# Patient Record
Sex: Female | Born: 1980 | Race: White | Hispanic: No | Marital: Married | Smoking: Current every day smoker
Health system: Southern US, Community
[De-identification: ages and names within clinical notes are randomized; demographics above are authoritative.]

## PROBLEM LIST (undated history)

## (undated) DIAGNOSIS — J45909 Unspecified asthma, uncomplicated: Secondary | ICD-10-CM

## (undated) DIAGNOSIS — M549 Dorsalgia, unspecified: Secondary | ICD-10-CM

## (undated) DIAGNOSIS — M1990 Unspecified osteoarthritis, unspecified site: Secondary | ICD-10-CM

## (undated) DIAGNOSIS — F329 Major depressive disorder, single episode, unspecified: Secondary | ICD-10-CM

## (undated) DIAGNOSIS — E119 Type 2 diabetes mellitus without complications: Secondary | ICD-10-CM

## (undated) DIAGNOSIS — F32A Depression, unspecified: Secondary | ICD-10-CM

## (undated) DIAGNOSIS — K219 Gastro-esophageal reflux disease without esophagitis: Secondary | ICD-10-CM

## (undated) HISTORY — PX: WISDOM TOOTH EXTRACTION: SHX21

## (undated) HISTORY — PX: APPENDECTOMY: SHX54

## (undated) HISTORY — DX: Type 2 diabetes mellitus without complications: E11.9

## (undated) HISTORY — DX: Dorsalgia, unspecified: M54.9

---
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
## 2003-12-26 ENCOUNTER — Emergency Department: Payer: Self-pay | Admitting: Emergency Medicine

## 2004-02-01 ENCOUNTER — Emergency Department: Payer: Self-pay | Admitting: Unknown Physician Specialty

## 2004-02-24 ENCOUNTER — Emergency Department: Payer: Self-pay | Admitting: Emergency Medicine

## 2004-03-22 ENCOUNTER — Emergency Department: Payer: Self-pay | Admitting: Emergency Medicine

## 2004-05-03 ENCOUNTER — Emergency Department: Payer: Self-pay | Admitting: Emergency Medicine

## 2004-11-23 ENCOUNTER — Emergency Department: Payer: Self-pay | Admitting: Emergency Medicine

## 2004-12-29 ENCOUNTER — Emergency Department: Payer: Self-pay | Admitting: Emergency Medicine

## 2005-01-25 ENCOUNTER — Emergency Department: Payer: Self-pay | Admitting: Emergency Medicine

## 2005-04-02 ENCOUNTER — Emergency Department: Payer: Self-pay | Admitting: Emergency Medicine

## 2005-05-10 ENCOUNTER — Emergency Department: Payer: Self-pay | Admitting: Emergency Medicine

## 2005-05-13 ENCOUNTER — Emergency Department: Payer: Self-pay | Admitting: Unknown Physician Specialty

## 2005-05-25 ENCOUNTER — Emergency Department: Payer: Self-pay | Admitting: General Practice

## 2005-06-10 ENCOUNTER — Emergency Department: Payer: Self-pay | Admitting: Emergency Medicine

## 2005-06-11 ENCOUNTER — Emergency Department: Payer: Self-pay | Admitting: Emergency Medicine

## 2005-08-01 ENCOUNTER — Emergency Department: Payer: Self-pay | Admitting: Emergency Medicine

## 2005-12-06 ENCOUNTER — Emergency Department: Payer: Self-pay | Admitting: Emergency Medicine

## 2006-01-10 ENCOUNTER — Emergency Department: Payer: Self-pay | Admitting: Emergency Medicine

## 2006-02-12 ENCOUNTER — Ambulatory Visit: Payer: Self-pay | Admitting: Obstetrics and Gynecology

## 2006-02-13 ENCOUNTER — Ambulatory Visit: Payer: Self-pay | Admitting: Obstetrics and Gynecology

## 2006-04-13 ENCOUNTER — Emergency Department: Payer: Self-pay

## 2006-04-24 ENCOUNTER — Emergency Department: Payer: Self-pay | Admitting: General Practice

## 2006-05-29 ENCOUNTER — Emergency Department: Payer: Self-pay | Admitting: Emergency Medicine

## 2006-10-01 ENCOUNTER — Emergency Department: Payer: Self-pay | Admitting: Emergency Medicine

## 2006-10-03 ENCOUNTER — Emergency Department: Payer: Self-pay | Admitting: Emergency Medicine

## 2006-12-15 ENCOUNTER — Emergency Department: Payer: Self-pay | Admitting: Emergency Medicine

## 2006-12-17 ENCOUNTER — Encounter: Payer: Self-pay | Admitting: *Deleted

## 2006-12-17 ENCOUNTER — Ambulatory Visit: Payer: Self-pay | Admitting: *Deleted

## 2007-01-15 ENCOUNTER — Encounter: Payer: Self-pay | Admitting: *Deleted

## 2007-01-29 ENCOUNTER — Ambulatory Visit: Payer: Self-pay | Admitting: Obstetrics and Gynecology

## 2007-02-20 ENCOUNTER — Inpatient Hospital Stay: Payer: Self-pay | Admitting: Obstetrics and Gynecology

## 2007-02-27 ENCOUNTER — Inpatient Hospital Stay: Payer: Self-pay | Admitting: Internal Medicine

## 2007-03-31 ENCOUNTER — Ambulatory Visit: Payer: Self-pay | Admitting: Family Medicine

## 2007-12-08 ENCOUNTER — Emergency Department: Payer: Self-pay | Admitting: Internal Medicine

## 2008-02-06 ENCOUNTER — Emergency Department: Payer: Self-pay | Admitting: Emergency Medicine

## 2008-04-01 ENCOUNTER — Emergency Department: Payer: Self-pay

## 2008-05-18 ENCOUNTER — Ambulatory Visit: Payer: Self-pay | Admitting: Family Medicine

## 2008-10-13 ENCOUNTER — Emergency Department: Payer: Self-pay | Admitting: Emergency Medicine

## 2008-11-19 ENCOUNTER — Emergency Department: Payer: Self-pay | Admitting: Unknown Physician Specialty

## 2009-10-19 ENCOUNTER — Emergency Department: Payer: Self-pay | Admitting: Emergency Medicine

## 2009-11-01 ENCOUNTER — Ambulatory Visit: Payer: Self-pay

## 2009-12-09 ENCOUNTER — Emergency Department: Payer: Self-pay | Admitting: Emergency Medicine

## 2010-04-05 ENCOUNTER — Encounter: Payer: Self-pay | Admitting: Family Medicine

## 2010-04-16 ENCOUNTER — Emergency Department: Payer: Self-pay | Admitting: Emergency Medicine

## 2010-10-18 ENCOUNTER — Ambulatory Visit: Payer: Self-pay

## 2011-12-09 ENCOUNTER — Emergency Department: Payer: Self-pay | Admitting: Emergency Medicine

## 2011-12-12 ENCOUNTER — Emergency Department: Payer: Self-pay | Admitting: Emergency Medicine

## 2011-12-12 LAB — CBC WITH DIFFERENTIAL/PLATELET
Basophil %: 0.2 %
Eosinophil #: 0.4 10*3/uL (ref 0.0–0.7)
HCT: 39.3 % (ref 35.0–47.0)
HGB: 13.4 g/dL (ref 12.0–16.0)
Lymphocyte %: 32.1 %
MCH: 27.2 pg (ref 26.0–34.0)
MCHC: 34.2 g/dL (ref 32.0–36.0)
MCV: 80 fL (ref 80–100)
Monocyte %: 4.1 %
Neutrophil #: 11.6 10*3/uL — ABNORMAL HIGH (ref 1.4–6.5)
RBC: 4.94 10*6/uL (ref 3.80–5.20)
RDW: 14.2 % (ref 11.5–14.5)
WBC: 18.7 10*3/uL — ABNORMAL HIGH (ref 3.6–11.0)

## 2011-12-12 LAB — BASIC METABOLIC PANEL
Anion Gap: 8 (ref 7–16)
BUN: 14 mg/dL (ref 7–18)
Calcium, Total: 9 mg/dL (ref 8.5–10.1)
Chloride: 105 mmol/L (ref 98–107)
Co2: 25 mmol/L (ref 21–32)
Creatinine: 0.81 mg/dL (ref 0.60–1.30)
EGFR (African American): >60
EGFR (Non-African Amer.): >60
Osmolality: 283 (ref 275–301)
Potassium: 4 mmol/L (ref 3.5–5.1)

## 2011-12-12 LAB — URINALYSIS, COMPLETE
Glucose,UR: 50 mg/dL (ref 0–75)
Nitrite: POSITIVE
Protein: 100
Specific Gravity: 1.031 (ref 1.003–1.030)
Squamous Epithelial: 1

## 2012-08-23 ENCOUNTER — Emergency Department: Payer: Self-pay | Admitting: Emergency Medicine

## 2012-09-12 ENCOUNTER — Emergency Department: Payer: Self-pay | Admitting: Emergency Medicine

## 2013-05-08 ENCOUNTER — Emergency Department: Payer: Self-pay | Admitting: Emergency Medicine

## 2013-07-24 ENCOUNTER — Emergency Department: Payer: Self-pay | Admitting: Internal Medicine

## 2013-07-28 ENCOUNTER — Emergency Department: Payer: Self-pay | Admitting: Internal Medicine

## 2014-06-29 ENCOUNTER — Ambulatory Visit: Payer: Self-pay

## 2015-02-28 ENCOUNTER — Emergency Department: Payer: Medicaid Other

## 2015-02-28 ENCOUNTER — Emergency Department
Admission: EM | Admit: 2015-02-28 | Discharge: 2015-02-28 | Disposition: A | Discharge status: Home / Self Care | Payer: Medicaid Other | Attending: Emergency Medicine | Admitting: Emergency Medicine

## 2015-02-28 ENCOUNTER — Encounter: Payer: Self-pay | Admitting: Medical Oncology

## 2015-02-28 DIAGNOSIS — F17200 Nicotine dependence, unspecified, uncomplicated: Secondary | ICD-10-CM | POA: Diagnosis not present

## 2015-02-28 DIAGNOSIS — N939 Abnormal uterine and vaginal bleeding, unspecified: Secondary | ICD-10-CM | POA: Diagnosis not present

## 2015-02-28 DIAGNOSIS — R102 Pelvic and perineal pain: Secondary | ICD-10-CM

## 2015-02-28 DIAGNOSIS — R109 Unspecified abdominal pain: Secondary | ICD-10-CM | POA: Diagnosis present

## 2015-02-28 HISTORY — DX: Unspecified asthma, uncomplicated: J45.909

## 2015-02-28 LAB — COMPREHENSIVE METABOLIC PANEL
ALK PHOS: 92 U/L (ref 38–126)
ALT: 21 U/L (ref 14–54)
AST: 16 U/L (ref 15–41)
Albumin: 3.3 g/dL — ABNORMAL LOW (ref 3.5–5.0)
Anion gap: 5 (ref 5–15)
BILIRUBIN TOTAL: 0.5 mg/dL (ref 0.3–1.2)
BUN: 10 mg/dL (ref 6–20)
CALCIUM: 8.9 mg/dL (ref 8.9–10.3)
CO2: 29 mmol/L (ref 22–32)
CREATININE: 0.68 mg/dL (ref 0.44–1.00)
Chloride: 103 mmol/L (ref 101–111)
GFR calc Af Amer: >60 mL/min (ref >60)
Glucose, Bld: 114 mg/dL — ABNORMAL HIGH (ref 65–99)
POTASSIUM: 4 mmol/L (ref 3.5–5.1)
Sodium: 137 mmol/L (ref 135–145)
TOTAL PROTEIN: 7.4 g/dL (ref 6.5–8.1)

## 2015-02-28 LAB — CBC
HEMATOCRIT: 41.7 % (ref 35.0–47.0)
Hemoglobin: 14 g/dL (ref 12.0–16.0)
MCH: 27.4 pg (ref 26.0–34.0)
MCHC: 33.7 g/dL (ref 32.0–36.0)
MCV: 81.5 fL (ref 80.0–100.0)
Platelets: 439 10*3/uL (ref 150–440)
RBC: 5.11 MIL/uL (ref 3.80–5.20)
RDW: 14.7 % — AB (ref 11.5–14.5)
WBC: 16.3 10*3/uL — AB (ref 3.6–11.0)

## 2015-02-28 LAB — URINALYSIS COMPLETE WITH MICROSCOPIC (ARMC ONLY)
Bilirubin Urine: NEGATIVE
Glucose, UA: NEGATIVE mg/dL
KETONES UR: NEGATIVE mg/dL
Leukocytes, UA: NEGATIVE
Nitrite: NEGATIVE
PH: 7 (ref 5.0–8.0)
PROTEIN: 30 mg/dL — AB
Specific Gravity, Urine: 1.013 (ref 1.005–1.030)

## 2015-02-28 LAB — HCG, QUANTITATIVE, PREGNANCY: hCG, Beta Chain, Quant, S: 1 m[IU]/mL (ref <5)

## 2015-02-28 LAB — POCT PREGNANCY, URINE: PREG TEST UR: POSITIVE — AB

## 2015-02-28 LAB — LIPASE, BLOOD: Lipase: 18 U/L (ref 11–51)

## 2015-02-28 MED ORDER — NITROFURANTOIN MACROCRYSTAL 100 MG PO CAPS: 100.0000 mg | ORAL_CAPSULE | Freq: Two times a day (BID) | ORAL | Status: DC

## 2015-02-28 NOTE — Discharge Instructions (Signed)
Your urine pregnancy test was positive but the blood test was negative.  Your ultrasound does not show any definitive pregnancy in the uterus or in any other abnormal location.  Please follow up with gynecology in 2 days for a recheck of the blood pregnancy test and further evaluation.    Ultrasound report summary: FINDINGS: Intrauterine gestational sac: There is an intraluminal fluid collection with septation measuring 1.7 x 0.9 x 1.6 cm  Yolk sac: None visualize  Embryo: None visualize  Cardiac Activity: None visualized  Heart Rate: n/a bpm  Subchorionic hemorrhage: None visualized.  Maternal uterus/adnexae: The right ovary exhibits a complex hypoechoic structure with internal echoes measuring 1.7 x 1.3 x 0.8 cm. It does not appear hypervascular. The left ovary could not be demonstrated due to the patient's body habitus and bowel gas.  IMPRESSION: 1. There is intra uterine fluid which does not appear to reflect a true gestational sac but it is indeterminate. No yolk sac or fetal pole or cardiac activity is observed. This could reflect retained products of conception given the vaginal bleeding and the very low beta HCG. 2. Complex appearing right ovarian cystic structure measuring 1.7 cm in maximal dimension. It is not hypervascular and [place] an ectopic pregnancy is felt less likely. Nonvisualization of the left ovary.  Abdominal Pain, Adult Many things can cause abdominal pain. Usually, abdominal pain is not caused by a disease and will improve without treatment. It can often be observed and treated at home. Your health care provider will do a physical exam and possibly order blood tests and X-rays to help determine the seriousness of your pain. However, in many cases, more time must pass before a clear cause of the pain can be found. Before that point, your health care provider may not know if you need more testing or further treatment. HOME CARE INSTRUCTIONS Monitor  your abdominal pain for any changes. The following actions may help to alleviate any discomfort you are experiencing:  Only take over-the-counter or prescription medicines as directed by your health care provider.  Do not take laxatives unless directed to do so by your health care provider.  Try a clear liquid diet (broth, tea, or water) as directed by your health care provider. Slowly move to a bland diet as tolerated. SEEK MEDICAL CARE IF:  You have unexplained abdominal pain.  You have abdominal pain associated with nausea or diarrhea.  You have pain when you urinate or have a bowel movement.  You experience abdominal pain that wakes you in the night.  You have abdominal pain that is worsened or improved by eating food.  You have abdominal pain that is worsened with eating fatty foods.  You have a fever. SEEK IMMEDIATE MEDICAL CARE IF:  Your pain does not go away within 2 hours.  You keep throwing up (vomiting).  Your pain is felt only in portions of the abdomen, such as the right side or the left lower portion of the abdomen.  You pass bloody or black tarry stools. MAKE SURE YOU:  Understand these instructions.  Will watch your condition.  Will get help right away if you are not doing well or get worse.   This information is not intended to replace advice given to you by your health care provider. Make sure you discuss any questions you have with your health care provider.   Document Released: 10/10/2004 Document Revised: 09/21/2014 Document Reviewed: 09/09/2012 Elsevier Interactive Patient Education 2016 Elsevier Inc.  Abnormal Uterine Bleeding Abnormal uterine  bleeding can affect women at various stages in life, including teenagers, women in their reproductive years, pregnant women, and women who have reached menopause. Several kinds of uterine bleeding are considered abnormal, including:  Bleeding or spotting between periods.   Bleeding after sexual  intercourse.   Bleeding that is heavier or more than normal.   Periods that last longer than usual.  Bleeding after menopause.  Many cases of abnormal uterine bleeding are minor and simple to treat, while others are more serious. Any type of abnormal bleeding should be evaluated by your health care provider. Treatment will depend on the cause of the bleeding. HOME CARE INSTRUCTIONS Monitor your condition for any changes. The following actions may help to alleviate any discomfort you are experiencing:  Avoid the use of tampons and douches as directed by your health care provider.  Change your pads frequently. You should get regular pelvic exams and Pap tests. Keep all follow-up appointments for diagnostic tests as directed by your health care provider.  SEEK MEDICAL CARE IF:   Your bleeding lasts more than 1 week.   You feel dizzy at times.  SEEK IMMEDIATE MEDICAL CARE IF:   You pass out.   You are changing pads every 15 to 30 minutes.   You have abdominal pain.  You have a fever.   You become sweaty or weak.   You are passing large blood clots from the vagina.   You start to feel nauseous and vomit. MAKE SURE YOU:   Understand these instructions.  Will watch your condition.  Will get help right away if you are not doing well or get worse.   This information is not intended to replace advice given to you by your health care provider. Make sure you discuss any questions you have with your health care provider.   Document Released: 12/31/2004 Document Revised: 01/05/2013 Document Reviewed: 07/30/2012 Elsevier Interactive Patient Education [employer]

## 2015-02-28 NOTE — ED Notes (Signed)
Pt reports that she has been having lower abd pain and nausea since yesterday. Has had positive preg test at home over weekend. Reports some vaginal spotting.

## 2015-02-28 NOTE — ED Provider Notes (Addendum)
[facility] Emergency Department Provider Note  ____________________________________________  Time seen: 3:05 PM  I have reviewed the triage vital signs and the nursing notes.   HISTORY  Chief Complaint Nausea and Abdominal Pain    HPI Kendra Perkins is a 35 y.o. female who complains of sharp pelvic pain for the past 24 hours and vaginal spotting for the past 24 hours as well. She took several pregnancy tests at home and at the health Department which had positive and negative results. Her last menstrual. Was 12/28/2014. She has been sexually active, unprotected, with her husband. Denies any vaginal discharge. No specific abdominal pain nausea vomiting diarrhea or fevers.     Past Medical History  Diagnosis Date  . Asthma      There are no active problems to display for this patient.    Past Surgical History  Procedure Laterality Date  . Cesarean section       No current outpatient prescriptions on file.   Allergies Review of patient's allergies indicates no known allergies.   No family history on file.  Social History Social History  Substance Use Topics  . Smoking status: Current Every Day Smoker  . Smokeless tobacco: None  . Alcohol Use: No    Review of Systems  Constitutional:   No fever or chills. No weight changes Eyes:   No blurry vision or double vision.  ENT:   No sore throat. Cardiovascular:   No chest pain. Respiratory:   No dyspnea or cough. Gastrointestinal:   Positive low abdominal pain without vomiting and diarrhea.  No BRBPR or melena. Genitourinary:   Negative for dysuria, urinary retention, bloody urine, or difficulty urinating. Musculoskeletal:   Negative for back pain. No joint swelling or pain. Skin:   Negative for rash. Neurological:   Negative for headaches, focal weakness or numbness. Psychiatric:  No anxiety or depression.   Endocrine:  No hot/cold intolerance, changes in energy, or sleep  difficulty.  10-point ROS otherwise negative.  ____________________________________________   PHYSICAL EXAM:  VITAL SIGNS: ED Triage Vitals  Enc Vitals Group     BP 02/28/15 1211 119/77 mmHg     Pulse Rate 02/28/15 1211 104     Resp 02/28/15 1211 19     Temp 02/28/15 1211 97.8 F (36.6 C)     Temp Source 02/28/15 1211 Oral     SpO2 02/28/15 1211 99 %     Weight 02/28/15 1211 230 lb (104.327 kg)     Height 02/28/15 1211  (1.676 m)     Head Cir --      Peak Flow --      Pain Score 02/28/15 1212 8     Pain Loc --      Pain Edu? --      Excl. in GC? --     Vital signs reviewed, nursing assessments reviewed.   Constitutional:   Alert and oriented. Well appearing and in no distress. Eyes:   No scleral icterus. No conjunctival pallor. PERRL. EOMI ENT   Head:   Normocephalic and atraumatic.   Nose:   No congestion/rhinnorhea. No septal hematoma   Mouth/Throat:   MMM, no pharyngeal erythema. No peritonsillar mass. No uvula shift.   Neck:   No stridor. No SubQ emphysema. No meningismus. Hematological/Lymphatic/Immunilogical:   No cervical lymphadenopathy. Cardiovascular:   RRR. Normal and symmetric distal pulses are present in all extremities. No murmurs, rubs, or gallops. Respiratory:   Normal respiratory effort without tachypnea nor retractions.  Breath sounds are clear and equal bilaterally. No wheezes/rales/rhonchi. Gastrointestinal:   Soft and nontender. No distention. There is no CVA tenderness.  No rebound, rigidity, or guarding. Genitourinary:   deferred Musculoskeletal:   Nontender with normal range of motion in all extremities. No joint effusions.  No lower extremity tenderness.  No edema. Neurologic:   Normal speech and language.  CN 2-10 normal. Motor grossly intact. No pronator drift.  Normal gait. No gross focal neurologic deficits are appreciated.  Skin:    Skin is warm, dry and intact. No rash noted.  No petechiae, purpura, or  bullae. Psychiatric:   Mood and affect are normal. Speech and behavior are normal. Patient exhibits appropriate insight and judgment.  ____________________________________________    LABS (pertinent positives/negatives) (all labs ordered are listed, but only abnormal results are displayed) Labs Reviewed  COMPREHENSIVE METABOLIC PANEL - Abnormal; Notable for the following:    Glucose, Bld 114 (*)    Albumin 3.3 (*)    All other components within normal limits  CBC - Abnormal; Notable for the following:    WBC 16.3 (*)    RDW 14.7 (*)    All other components within normal limits  URINALYSIS COMPLETEWITH MICROSCOPIC ([facility] ONLY) - Abnormal; Notable for the following:    Color, Urine YELLOW (*)    APPearance CLOUDY (*)    Hgb urine dipstick 3+ (*)    Protein, ur 30 (*)    Bacteria, UA RARE (*)    Squamous Epithelial / LPF TOO NUMEROUS TO COUNT (*)    All other components within normal limits  POCT PREGNANCY, URINE - Abnormal; Notable for the following:    Preg Test, Ur POSITIVE (*)    All other components within normal limits  LIPASE, BLOOD  HCG, QUANTITATIVE, PREGNANCY  POC URINE PREG, ED   ____________________________________________   EKG    ____________________________________________    RADIOLOGY  Ultrasound shows no definitive IUP, possible residual POC's. No definitive adnexal mass although there is a 1.7 cm cystic structure. Not thought to represent an ectopic.  FINDINGS: Intrauterine gestational sac: There is an intraluminal fluid collection with septation measuring 1.7 x 0.9 x 1.6 cm  Yolk sac: None visualize  Embryo: None visualize  Cardiac Activity: None visualized  Heart Rate: n/a bpm  Subchorionic hemorrhage: None visualized.  Maternal uterus/adnexae: The right ovary exhibits a complex hypoechoic structure with internal echoes measuring 1.7 x 1.3 x 0.8 cm. It does not appear hypervascular. The left ovary could not be demonstrated due  to the patient's body habitus and bowel gas.  IMPRESSION: 1. There is intra uterine fluid which does not appear to reflect a true gestational sac but it is indeterminate. No yolk sac or fetal pole or cardiac activity is observed. This could reflect retained products of conception given the vaginal bleeding and the very low beta HCG. 2. Complex appearing right ovarian cystic structure measuring 1.7 cm in maximal dimension. It is not hypervascular and [place] an ectopic pregnancy is felt less likely. Nonvisualization of the left ovary.  ____________________________________________   PROCEDURES   ____________________________________________   INITIAL IMPRESSION / ASSESSMENT AND PLAN / ED COURSE  Pertinent labs & imaging results that were available during my care of the patient were reviewed by me and considered in my medical decision making (see chart for details).  Patient presents with positive urine pregnancy, negative serum hCG, unclear pelvic ultrasound, and a week interval since her last period. Is possible that she has had a miscarriage. Given the  uncertainty we'll have her follow-up with gynecology in 2 or 3 days for recheck of hCG and further evaluation. Currently her vital signs are normal and stable, she is calm and comfortable without significant pain or tenderness. Exam is reassuring. Low suspicion for PID TOA ectopic or torsion.   Urinalysis is equivocal for UTI. She does have an elevated white blood cell count, so we'll send a culture and start Macrobid.  ____________________________________________   FINAL CLINICAL IMPRESSION(S) / ED DIAGNOSES  Final diagnoses:  Vaginal bleeding, abnormal      Sharman Cheek, MD 02/28/15 1702  Sharman Cheek, MD 02/28/15 [id number]

## 2015-03-02 LAB — URINE CULTURE

## 2015-03-03 NOTE — Progress Notes (Signed)
ED Urine Culture from 2/14 resulted with Klebsiella with intermediate sensitivity to macrobid, which is what the patient was sent home on.   Received script for cephalexin  PO q12hrs x7days from [facility].   Spoke to patient, she requested we call in to [facility].   Script called in 1715 on 03/03/15.  Roque Cash, PharmD Pharmacy Resident 03/03/2015

## 2015-05-25 ENCOUNTER — Other Ambulatory Visit: Payer: Self-pay | Admitting: Nurse Practitioner

## 2015-05-25 DIAGNOSIS — Z1231 Encounter for screening mammogram for malignant neoplasm of breast: Secondary | ICD-10-CM

## 2015-05-25 DIAGNOSIS — Z803 Family history of malignant neoplasm of breast: Secondary | ICD-10-CM

## 2015-06-09 ENCOUNTER — Ambulatory Visit: Payer: Medicaid Other | Attending: Nurse Practitioner

## 2015-10-02 ENCOUNTER — Ambulatory Visit (INDEPENDENT_AMBULATORY_CARE_PROVIDER_SITE_OTHER): Payer: Medicaid Other | Admitting: Obstetrics and Gynecology

## 2015-10-02 ENCOUNTER — Encounter: Payer: Self-pay | Admitting: *Deleted

## 2015-10-02 ENCOUNTER — Encounter: Payer: Self-pay | Admitting: Obstetrics and Gynecology

## 2015-10-02 DIAGNOSIS — G8929 Other chronic pain: Secondary | ICD-10-CM | POA: Insufficient documentation

## 2015-10-02 DIAGNOSIS — O0990 Supervision of high risk pregnancy, unspecified, unspecified trimester: Secondary | ICD-10-CM | POA: Insufficient documentation

## 2015-10-02 DIAGNOSIS — Z98891 History of uterine scar from previous surgery: Secondary | ICD-10-CM | POA: Diagnosis not present

## 2015-10-02 DIAGNOSIS — O0991 Supervision of high risk pregnancy, unspecified, first trimester: Secondary | ICD-10-CM

## 2015-10-02 DIAGNOSIS — Z720 Tobacco use: Secondary | ICD-10-CM

## 2015-10-02 DIAGNOSIS — Z8632 Personal history of gestational diabetes: Secondary | ICD-10-CM | POA: Insufficient documentation

## 2015-10-02 DIAGNOSIS — Z6841 Body Mass Index (BMI) 40.0 and over, adult: Secondary | ICD-10-CM | POA: Diagnosis not present

## 2015-10-02 DIAGNOSIS — O09291 Supervision of pregnancy with other poor reproductive or obstetric history, first trimester: Secondary | ICD-10-CM | POA: Diagnosis not present

## 2015-10-02 DIAGNOSIS — M545 Low back pain, unspecified: Secondary | ICD-10-CM | POA: Insufficient documentation

## 2015-10-02 DIAGNOSIS — O99210 Obesity complicating pregnancy, unspecified trimester: Secondary | ICD-10-CM

## 2015-10-02 NOTE — Progress Notes (Signed)
Transvaginal US performed, gestational sac and yolk sac noted measuring 6w 0d and small fetal pole noted with + HR = 117.

## 2015-10-02 NOTE — Progress Notes (Signed)
New OB Note  10/02/2015   Clinic: [facility]  Chief Complaint: NOB  Transfer of Care Patient: no  History of Present Illness: Ms. Farabee is a 35 y.o. [id number] @ 6/0 weeks (EDC 5/14, based on bedside u/s today). LMP 7/18.  Preg complicated by has Supervision of high-risk pregnancy; History of pre-eclampsia in prior pregnancy, currently pregnant in first trimester; History of gestational diabetes mellitus (GDM); History of cesarean delivery; BMI 40.0-44.9, adult (HCC); and Obesity in pregnancy on her problem list.   Her periods were: regular, qmonth. She thinks her first +UPT was two weeks ago She was using no method when she conceived.  She has Negative signs or symptoms of nausea/vomiting of pregnancy. She has Negative signs or symptoms of miscarriage or preterm labor She identifies Negative Zika risk factors for her and her partner On any different medications around the time she conceived/early pregnancy: No   ROS: A 12-point review of systems was performed and negative, except as stated in the above HPI.  OBGYN History: As per HPI. OB History  Gravida Para Term Preterm AB Living  4 2   2 1 2   SAB TAB Ectopic Multiple Live Births  1       2    # Outcome Date GA Lbr Len/2nd Weight Sex Delivery Anes PTL Lv  4 Current           3 SAB 01/2015 [redacted]w[redacted]d         2 Preterm 03/10/01 [redacted]w[redacted]d  9 lb 8 oz (4.309 kg) M CS-LVertical   LIV  1 Preterm 02/02/00 [redacted]w[redacted]d  3 lb 15 oz (1.786 kg) F CS-LTranv   LIV    Obstetric Comments  Iatrogenic PTB: 32wks (pre-eclampsia), 36wk (patient states she was delivered early b/c the baby was too big)    Any issues with any prior pregnancies: yes Any prior children are healthy, doing well, without any problems or issues: no History of pap smears: Yes. Last pap smear unknown. Abnormal: no History of STIs: No   Past Medical History: Past Medical History:  Diagnosis Date  . Asthma   . Back pain 2000  . Diabetes mellitus without  complication (HCC)   . Gestational diabetes   . Severe pre-eclampsia     Past Surgical History: Past Surgical History:  Procedure Laterality Date  . APPENDECTOMY     ?. pt thinks she had it removed (laparoscopically)  . CESAREAN SECTION      Family History:  Family History  Problem Relation Age of Onset  . Cancer Mother   . Diabetes Father   . Alcohol abuse Father   . Multiple sclerosis Daughter    She denies any female cancers, bleeding or blood clotting disorders.  She denies any history of mental retardation, birth defects or genetic disorders in her or the FOB's history  Social History:  Social History   Social History  . Marital status: Married    Spouse name: N/A  . Number of children: N/A  . Years of education: N/A   Occupational History  . Not on file.   Social History Main Topics  . Smoking status: Current Every Day Smoker    Packs/day: 0.25    Types: Cigarettes  . Smokeless tobacco: Never Used  . Alcohol use No  . Drug use: No  . Sexual activity: Yes    Birth control/ protection: None   Other Topics Concern  . Not on file   Social History Narrative  .  No narrative on file    Allergy: No Known Allergies  Health Maintenance:  Mammogram Up to Date: not applicable  Current Outpatient Medications: Naproxen PRN (1-2x/wk), tramadol 50mg  q6-8h scheduled, albuterol (rare use), PNV  Physical Exam:   BP 138/85   Pulse (!) 106   Wt 261 lb (118.4 kg)   LMP 08/01/2015   BMI 42.13 kg/m  Body mass index is 42.13 kg/m. Fundal height: not applicable FHTs: ?110s on bedside u/s  General appearance: Well nourished, well developed female in no acute distress.   Laboratory: none  Imaging:  As above  Assessment: pt doing well  Plan: 1. Supervision of high-risk pregnancy, first trimester Encouraged tobacco cessation and risks associated with that. Needs to confirm viability with u/s in two weeks. Looks like SLIUP today but too early to say  definitively. Needs exam and NOB labs at that time too  2. History of pre-eclampsia in prior pregnancy, currently pregnant in first trimester Baseline pre-x labs with NOB labs Start baby asa after 12wks  3. History of gestational diabetes mellitus (GDM) Early 1hr with NOB labs in two weeks  4. History of cesarean delivery X2. No issues currently   5. BMI 40.0-44.9, adult (HCC) See above  6. H/o PTB x 2 Both iatrogenic. No need for change in care.   7. Pain Patient has chronic LBP and [facility]. D/w pt to d/c NSAIDs for the pregnancy and risk of NAAS d/w her and pt told to let clinic know she's pregnant in any change in meds are needed, from their perspective.   Problem list reviewed and updated.  Follow up in 2 weeks.  >50% of 20 min visit spent on counseling and coordination of care.     Cornelia Copaharlie Morgon Pamer, Jr. MD Attending [facility](Faculty Practice)

## 2015-10-18 ENCOUNTER — Ambulatory Visit (INDEPENDENT_AMBULATORY_CARE_PROVIDER_SITE_OTHER): Payer: Medicaid Other | Admitting: Family Medicine

## 2015-10-18 ENCOUNTER — Encounter: Payer: Self-pay | Admitting: Family Medicine

## 2015-10-18 VITALS — HR 94 | Wt 261.0 lb

## 2015-10-18 DIAGNOSIS — O021 Missed abortion: Secondary | ICD-10-CM | POA: Diagnosis not present

## 2015-10-18 LAB — TSH: TSH: 2.22 mIU/L

## 2015-10-18 MED ORDER — MISOPROSTOL 200 MCG PO TABS: ORAL_TABLET | ORAL | 1 refills | Status: DC

## 2015-10-18 MED ORDER — IBUPROFEN 800 MG PO TABS: 800.0000 mg | ORAL_TABLET | Freq: Three times a day (TID) | ORAL | 1 refills | Status: DC | PRN

## 2015-10-18 MED ORDER — ONDANSETRON 4 MG PO TBDP: 4.0000 mg | ORAL_TABLET | Freq: Four times a day (QID) | ORAL | 0 refills | Status: DC | PRN

## 2015-10-18 NOTE — Patient Instructions (Addendum)
There are three options for managing a miscarriage: 1) Expectant Management: This is where you wait for 2 weeks to see if the body passes the pregnancy on its own 2) Cytotec (Misoprostol): this is a medication that causes cramping of the uterus and can help passage of the pregnancy.  3) Dilation and curettage: this is a surgical procedure to remove the pregnancy from the uterus  You have decided to do EXPECTANT MANAGEMENT  We will see you in 2 weeks to confirm passage of the pregnancy or to help you with passage of your pregnancy. You were given a prescription for 3 medications.You were given three prescription for Cytotec - if you decide you want to take this in the next 2 weeks, Zofran for nausea, and Ibuprofen for pain.   You need to call or go to the emergency room for: -bleeding that fills up 1 pad per hour -Severe abdominal pain -Dizziness/lightheadedness -passing out Or any medical concern    Miscarriage A miscarriage is the sudden loss of an unborn baby (fetus) before the 20th week of pregnancy. Most miscarriages happen in the first 3 months of pregnancy. Sometimes, it happens before a woman even knows she is pregnant. A miscarriage is also called a "spontaneous miscarriage" or "early pregnancy loss." Having a miscarriage can be an emotional experience. Talk with your caregiver about any questions you may have about miscarrying, the grieving process, and your future pregnancy plans. CAUSES   Problems with the fetal chromosomes that make it impossible for the baby to develop normally. Problems with the baby's genes or chromosomes are most often the result of errors that occur, by chance, as the embryo divides and grows. The problems are not inherited from the parents.  Infection of the cervix or uterus.   Hormone problems.   Problems with the cervix, such as having an incompetent cervix. This is when the tissue in the cervix is not strong enough to hold the pregnancy.    Problems with the uterus, such as an abnormally shaped uterus, uterine fibroids, or congenital abnormalities.   Certain medical conditions.   Smoking, drinking alcohol, or taking illegal drugs.   Trauma.  Often, the cause of a miscarriage is unknown.  SYMPTOMS   Vaginal bleeding or spotting, with or without cramps or pain.  Pain or cramping in the abdomen or lower back.  Passing fluid, tissue, or blood clots from the vagina. DIAGNOSIS  Your caregiver will perform a physical exam. You may also have an ultrasound to confirm the miscarriage. Blood or urine tests may also be ordered. TREATMENT   Sometimes, treatment is not necessary if you naturally pass all the fetal tissue that was in the uterus. If some of the fetus or placenta remains in the body (incomplete miscarriage), tissue left behind may become infected and must be removed. Usually, a dilation and curettage (D and C) procedure is performed. During a D and C procedure, the cervix is widened (dilated) and any remaining fetal or placental tissue is gently removed from the uterus.  Antibiotic medicines are prescribed if there is an infection. Other medicines may be given to reduce the size of the uterus (contract) if there is a lot of bleeding.  If you have Rh negative blood and your baby was Rh positive, you will need a Rh immunoglobulin shot. This shot will protect any future baby from having Rh blood problems in future pregnancies. HOME CARE INSTRUCTIONS   Your caregiver may order bed rest or may allow you to  continue light activity. Resume activity as directed by your caregiver.  Have someone help with home and family responsibilities during this time.   Keep track of the number of sanitary pads you use each day and how soaked (saturated) they are. Write down this information.   Do not use tampons. Do not douche or have sexual intercourse until approved by your caregiver.   Only take over-the-counter or  prescription medicines for pain or discomfort as directed by your caregiver.   Do not take aspirin. Aspirin can cause bleeding.   Keep all follow-up appointments with your caregiver.   If you or your partner have problems with grieving, talk to your caregiver or seek counseling to help cope with the pregnancy loss. Allow enough time to grieve before trying to get pregnant again.  SEEK IMMEDIATE MEDICAL CARE IF:   You have severe cramps or pain in your back or abdomen.  You have a fever.  You pass large blood clots (walnut-sized or larger) ortissue from your vagina. Save any tissue for your caregiver to inspect.   Your bleeding increases.   You have a thick, bad-smelling vaginal discharge.  You become lightheaded, weak, or you faint.   You have chills.  MAKE SURE YOU:  Understand these instructions.  Will watch your condition.  Will get help right away if you are not doing well or get worse.   This information is not intended to replace advice given to you by your health care provider. Make sure you discuss any questions you have with your health care provider.   Document Released: 06/26/2000 Document Revised: 04/27/2012 Document Reviewed: 02/19/2011 Elsevier Interactive Patient Education [employer]

## 2015-10-18 NOTE — Progress Notes (Signed)
Bedside Transvaginal US performed, SIUP noted with CRL measuring 7w 2d, No FHR detected.

## 2015-10-18 NOTE — Progress Notes (Signed)
   PRENATAL VISIT NOTE  Subjective:  Kendra Perkins is a 35 y.o. [phone] [id number] at 7020w2d being seen today for ongoing prenatal care.  She is currently monitored for the following issues for this high-risk pregnancy and has Supervision of high-risk pregnancy; History of pre-eclampsia in prior pregnancy, currently pregnant in first trimester; History of gestational diabetes mellitus (GDM); History of cesarean delivery; BMI 40.0-44.9, adult (HCC); Obesity in pregnancy; Tobacco abuse; and Chronic low back pain on her problem list.  Patient reports no complaints.  Contractions: Not present. Vag. Bleeding: None.  Movement: Absent. Denies leaking of fluid.   I was called into the room during pelvic US due to concern by RN regarding viability scan. Scan confirmed lack of cardiac activity and CRL that was lower than expected.   The following portions of the patient's history were reviewed and updated as appropriate: allergies, current medications, past family history, past medical history, past social history, past surgical history and problem list. Problem list updated.  Objective:   Vitals:   10/18/15 0840  BP: 130/84  Pulse: 94  Weight: 261 lb (118.4 kg)    Fetal Status: Fetal Heart Rate (bpm): 0   Movement: Absent     General:  Alert, oriented and cooperative. Patient is in no acute distress.  Skin: Skin is warm and dry. No rash noted.   Cardiovascular: Normal heart rate noted  Respiratory: Normal respiratory effort, no problems with respiration noted  Abdomen: Soft, gravid, appropriate for gestational age. Pain/Pressure: Present     Pelvic:  Cervical exam deferred        Extremities: Normal range of motion.  Edema: None  Mental Status: Normal mood and affect. Normal behavior. Normal judgment and thought content.   Urinalysis:      Assessment and Plan:  Pregnancy: [id number] at 120w2d  1. Missed abortion - Confirmed lack of cardiac activity and CRL measuring 6463w5d. This is patient's second  MAB this year and I will get basic labs to check for modifiable risk factors. She has been pregnant before and thus structural issues are less likely but if she continues to have miscarriage a hysteroscopy might be warranted.  - Counseled patient on common nature of early 1st trimester miscarriages.  - TSH - Cardiolipin antibody - Lupus anticoagulant panel - Patient would like option to take cytotec at home if she decides later - ondansetron (ZOFRAN ODT) 4 MG disintegrating tablet; Take 1 tablet (4 mg total) by mouth every 6 (six) hours as needed for nausea.  Dispense: 20 tablet; Refill: 0 - misoprostol (CYTOTEC) 200 MCG tablet; Insert four tablets vaginally.  Dispense: 4 tablet; Refill: 1 - ibuprofen (ADVIL,MOTRIN) 800 MG tablet; Take 1 tablet (800 mg total) by mouth every 8 (eight) hours as needed.  Dispense: 60 tablet; Refill: 1 -No narcotics were prescribed due to pain contract with [facility] pain. She had tramadol at home and reports enough to take 102 extra if needed -- Patient will need a HA1C when she returns   >50% of this 25 minute visit was spent in direct coordination of care, counseling regarding miscarriage, joint decision making about miscarriage management options.   Miscarriage precautions and general obstetric precautions including but not limited to vaginal bleeding, contractions, leaking of fluid and fetal movement were reviewed in detail with the patient.  Please refer to After Visit Summary for other counseling recommendations.  Return in about 2 weeks (around 11/01/2015) for Follow missed abortion.  Federico FlakeKimberly Niles Calder Oblinger, MD

## 2015-10-20 LAB — RFX DRVVT SCR W/RFLX CONF 1:1 MIX: dRVVT Screen: 35 s (ref <=45)

## 2015-10-20 LAB — CARDIOLIPIN ANTIBODY: PHOSPHOLIPIDS: 194 mg/dL (ref 151–264)

## 2015-10-20 LAB — RFX PTT-LA W/RFX TO HEX PHASE CONF: PTT-LA Screen: 52 s — ABNORMAL HIGH (ref <=40)

## 2015-10-20 LAB — THROMBIN CLOTTING TIME: THROMBIN CLOTTING TIME: 16 s (ref 13–19)

## 2015-10-20 LAB — LUPUS ANTICOAGULANT PANEL

## 2015-10-20 LAB — RFLX HEXAGONAL PHASE CONFIRM: HEXAGONAL PHASE CONFIRM: POSITIVE — AB

## 2015-10-25 ENCOUNTER — Telehealth: Payer: Self-pay | Admitting: *Deleted

## 2015-10-25 NOTE — Telephone Encounter (Signed)
-----   Message from Kimberly Niles Newton, MD sent at 10/25/2015 11:48 AM EDT ----- Please call patient and check on how she is feeling and if she took the cytotec. We will need to repeat her blood work in 6-8 weeks (approx mid Nov). There was one test for lupus which can cause pregnancy loss but it needs to be repeated because I can also be positive after a viral illness. We can discuss this further at her next appointment.  Thanks,  Dr. Newton 

## 2015-10-25 NOTE — Telephone Encounter (Signed)
Called pt, no answer, unable to leave message.

## 2015-10-26 ENCOUNTER — Telehealth: Payer: Self-pay | Admitting: *Deleted

## 2015-10-26 NOTE — Telephone Encounter (Signed)
Called pt. , no answer, unable to leave a message

## 2015-10-26 NOTE — Telephone Encounter (Signed)
-----   Message from Kimberly Niles Newton, MD sent at 10/25/2015 11:48 AM EDT ----- Please call patient and check on how she is feeling and if she took the cytotec. We will need to repeat her blood work in 6-8 weeks (approx mid Nov). There was one test for lupus which can cause pregnancy loss but it needs to be repeated because I can also be positive after a viral illness. We can discuss this further at her next appointment.  Thanks,  Dr. Newton 

## 2015-10-27 ENCOUNTER — Telehealth: Payer: Self-pay | Admitting: *Deleted

## 2015-10-27 NOTE — Telephone Encounter (Signed)
-----   Message from Federico FlakeKimberly Niles Newton, MD sent at 10/25/2015 11:48 AM EDT ----- Please call patient and check on how she is feeling and if she took the cytotec. We will need to repeat her blood work in 6-8 weeks (approx mid Nov). There was one test for lupus which can cause pregnancy loss but it needs to be repeated because I can also be positive after a viral illness. We can discuss this further at her next appointment.  Thanks,  Dr. Alvester MorinNewton

## 2015-10-27 NOTE — Telephone Encounter (Signed)
Pt took the cytotec late last week, started experiencing cramping and started having vaginal bleeding a few days later and is still having vaginal bleeding at this time.  Informed pt that cramping is normal and was induced by the cytotec.  Instructed to alternate Ibuprofen and Tylenol as well as her Tramadol and to use heat applications.  Pt acknowledged instructions.  Has follow-up on 11-01-15.

## 2015-11-01 ENCOUNTER — Encounter: Payer: Self-pay | Admitting: Family Medicine

## 2015-11-01 ENCOUNTER — Ambulatory Visit (INDEPENDENT_AMBULATORY_CARE_PROVIDER_SITE_OTHER): Payer: Medicaid Other | Admitting: Family Medicine

## 2015-11-01 VITALS — BP 129/83 | HR 91 | Ht 66.0 in | Wt 267.0 lb

## 2015-11-01 DIAGNOSIS — Z3689 Encounter for other specified antenatal screening: Secondary | ICD-10-CM | POA: Diagnosis not present

## 2015-11-01 DIAGNOSIS — O034 Incomplete spontaneous abortion without complication: Secondary | ICD-10-CM

## 2015-11-01 DIAGNOSIS — O021 Missed abortion: Secondary | ICD-10-CM

## 2015-11-01 MED ORDER — TRAMADOL HCL 50 MG PO TABS: 50.0000 mg | ORAL_TABLET | Freq: Two times a day (BID) | ORAL | 0 refills | Status: DC | PRN

## 2015-11-01 NOTE — Progress Notes (Signed)
Patient ID: Kendra Perkins, female   DOB: Nov 05, 1980, 35 y.o.   MRN: [id number]    PRENATAL VISIT NOTE  Subjective:  Kendra Perkins is a 35 y.o. [id number] at 59580w2d being seen today for ongoing prenatal care.  She is currently monitored for the following issues for this high-risk pregnancy and has Supervision of high-risk pregnancy; History of pre-eclampsia in prior pregnancy, currently pregnant in first trimester; History of gestational diabetes mellitus (GDM); History of cesarean delivery; BMI 40.0-44.9, adult (HCC); Obesity in pregnancy; Tobacco abuse; Chronic low back pain; and Missed abortion on her problem list.  Here today for follow up missed AB. She took Cytotec last week (Monday/Tuesday). Reports cramping starting Thurs 10/12 with increasing intensity and peak on Saturday 10/14. She reports heavy bleeding on Saturday. The bleeding has decreased but she continues to use 3-4 pads per day and reports significant cramping.   Performed bedside US given bleeding pattern which confirmed continued presence of POC with gestational sac clearly present with some blood collection in uterine cavity.   The following portions of the patient's history were reviewed and updated as appropriate: allergies, current medications, past family history, past medical history, past social history, past surgical history and problem list. Problem list updated.  Objective:   Vitals:   11/01/15 0951  BP: 129/83  Pulse: 91  Weight: 267 lb (121.1 kg)  Height: 5\' 6"  (1.676 m)    Fetal Status:           General:  Alert, oriented and cooperative. Patient is in no acute distress.  Skin: Skin is warm and dry. No rash noted.   Cardiovascular: Normal heart rate noted  Respiratory: Normal respiratory effort, no problems with respiration noted  Abdomen: Soft, gravid, appropriate for gestational age.       Pelvic:  Cervical exam deferred        Cervical os is closed. Blood in vaginal vault.   Extremities: Normal  range of motion.     Mental Status: Normal mood and affect. Normal behavior. Normal judgment and thought content.   Urinalysis:      Assessment and Plan:  Pregnancy: [id number] at 79580w2d  1. Missed abortion/Incomplete abortion - Confirmed that patient had not passed POC, gestational sac still present measuring ~8wks seen on transvaginal US.  - Given rx for tramadol #30 tabs for cramping - Message sent to [place] to schedule DC  - Instructed to call if she had increased passage of blood or tissue for office US   2. Recurrent miscarriage - HA1C today - Lupus anticoagulant was positive, but will need repeat in 6-8 weeks to confirm diagnosis.   >50% of this 25 minute visit was spent in direct coordination of care, counseling regarding miscarriage, joint decision making about miscarriage management options and preparing for surgery  Please refer to After Visit Summary for other counseling recommendations.  Return [facility], message sent to [place].  Federico FlakeKimberly Niles Antjuan Rothe, MD

## 2015-11-01 NOTE — Progress Notes (Signed)
Pt states that she has been experiencing severe pain. Saturday (10/28/2015) pain was at an all time high. Still experiencing bleeding and pain today.

## 2015-11-01 NOTE — Patient Instructions (Signed)
Incomplete Miscarriage A miscarriage is the sudden loss of an unborn baby (fetus) before the 20th week of pregnancy. In an incomplete miscarriage, parts of the fetus or placenta (afterbirth) remain in the body.  Having a miscarriage can be an emotional experience. Talk with your health care provider about any questions you may have about miscarrying, the grieving process, and your future pregnancy plans. CAUSES   Problems with the fetal chromosomes that make it impossible for the baby to develop normally. Problems with the baby's genes or chromosomes are most often the result of errors that occur by chance as the embryo divides and grows. The problems are not inherited from the parents.  Infection of the cervix or uterus.  Hormone problems.  Problems with the cervix, such as having an incompetent cervix. This is when the tissue in the cervix is not strong enough to hold the pregnancy.  Problems with the uterus, such as an abnormally shaped uterus, uterine fibroids, or congenital abnormalities.  Certain medical conditions.  Smoking, drinking alcohol, or taking illegal drugs.  Trauma. SYMPTOMS   Vaginal bleeding or spotting, with or without cramps or pain.  Pain or cramping in the abdomen or lower back.  Passing fluid, tissue, or blood clots from the vagina. DIAGNOSIS  Your health care provider will perform a physical exam. You may also have an ultrasound to confirm the miscarriage. Blood or urine tests may also be ordered. TREATMENT   Usually, a dilation and curettage (D&C) procedure is performed. During a D&C procedure, the cervix is widened (dilated) and any remaining fetal or placental tissue is gently removed from the uterus.  Antibiotic medicines are prescribed if there is an infection. Other medicines may be given to reduce the size of the uterus (contract) if there is a lot of bleeding.  If you have Rh negative blood and your baby was Rh positive, you will need a Rho (D)  immune globulin shot. This shot will protect any future baby from having Rh blood problems in future pregnancies.  You may be confined to bed rest. This means you should stay in bed and only get up to use the bathroom. HOME CARE INSTRUCTIONS   Rest as directed by your health care provider.  Restrict activity as directed by your health care provider. You may be allowed to continue light activity if curettage was not done but you require further treatment.  Keep track of the number of pads you use each day. Keep track of how soaked (saturated) they are. Record this information.  Do not  use tampons.  Do not douche or have sexual intercourse until approved by your health care provider.  Keep all follow-up appointments for reevaluation and continuing management.  Only take over-the-counter or prescription medicines for pain, fever, or discomfort as directed by your health care provider.  Take antibiotic medicine as directed by your health care provider. Make sure you finish it even if you start to feel better. SEEK IMMEDIATE MEDICAL CARE IF:   You experience severe cramps in your stomach, back, or abdomen.  You have an unexplained temperature (make sure to record these temperatures).  You pass large clots or tissue (save these for your health care provider to inspect).  Your bleeding increases.  You become light-headed, weak, or have fainting episodes. MAKE SURE YOU:   Understand these instructions.  Will watch your condition.  Will get help right away if you are not doing well or get worse.   This information is not intended to   replace advice given to you by your health care provider. Make sure you discuss any questions you have with your health care provider.   Document Released: 12/31/2004 Document Revised: 01/21/2014 Document Reviewed: 07/30/2012 Elsevier Interactive Patient Education 2016 Elsevier Inc.  

## 2015-11-02 ENCOUNTER — Encounter (HOSPITAL_COMMUNITY): Payer: Self-pay | Admitting: *Deleted

## 2015-11-02 LAB — HEMOGLOBIN A1C
HEMOGLOBIN A1C: 8 % — AB (ref <5.7)
MEAN PLASMA GLUCOSE: 183 mg/dL

## 2015-11-08 ENCOUNTER — Encounter: Payer: Self-pay | Admitting: Family Medicine

## 2015-11-08 ENCOUNTER — Encounter (HOSPITAL_COMMUNITY)
Admission: AD | Disposition: A | Discharge status: Home / Self Care | Payer: Self-pay | Source: Ambulatory Visit | Attending: Family Medicine

## 2015-11-08 ENCOUNTER — Ambulatory Visit (HOSPITAL_COMMUNITY): Payer: Medicaid Other | Admitting: Anesthesiology

## 2015-11-08 ENCOUNTER — Ambulatory Visit (HOSPITAL_COMMUNITY)
Admission: AD | Admit: 2015-11-08 | Discharge: 2015-11-08 | Disposition: A | Discharge status: Home / Self Care | Payer: Medicaid Other | Source: Ambulatory Visit | Attending: Family Medicine | Admitting: Family Medicine

## 2015-11-08 ENCOUNTER — Encounter (HOSPITAL_COMMUNITY): Payer: Self-pay

## 2015-11-08 DIAGNOSIS — Z720 Tobacco use: Secondary | ICD-10-CM | POA: Diagnosis present

## 2015-11-08 DIAGNOSIS — O99331 Smoking (tobacco) complicating pregnancy, first trimester: Secondary | ICD-10-CM | POA: Insufficient documentation

## 2015-11-08 DIAGNOSIS — Z6841 Body Mass Index (BMI) 40.0 and over, adult: Secondary | ICD-10-CM | POA: Insufficient documentation

## 2015-11-08 DIAGNOSIS — O99211 Obesity complicating pregnancy, first trimester: Secondary | ICD-10-CM | POA: Diagnosis not present

## 2015-11-08 DIAGNOSIS — Z79899 Other long term (current) drug therapy: Secondary | ICD-10-CM | POA: Diagnosis not present

## 2015-11-08 DIAGNOSIS — O021 Missed abortion: Secondary | ICD-10-CM | POA: Diagnosis not present

## 2015-11-08 DIAGNOSIS — O99511 Diseases of the respiratory system complicating pregnancy, first trimester: Secondary | ICD-10-CM | POA: Diagnosis not present

## 2015-11-08 DIAGNOSIS — E119 Type 2 diabetes mellitus without complications: Secondary | ICD-10-CM | POA: Insufficient documentation

## 2015-11-08 DIAGNOSIS — O26899 Other specified pregnancy related conditions, unspecified trimester: Secondary | ICD-10-CM

## 2015-11-08 DIAGNOSIS — J45909 Unspecified asthma, uncomplicated: Secondary | ICD-10-CM | POA: Diagnosis not present

## 2015-11-08 DIAGNOSIS — F17210 Nicotine dependence, cigarettes, uncomplicated: Secondary | ICD-10-CM | POA: Insufficient documentation

## 2015-11-08 DIAGNOSIS — K219 Gastro-esophageal reflux disease without esophagitis: Secondary | ICD-10-CM | POA: Diagnosis not present

## 2015-11-08 DIAGNOSIS — Z3A08 8 weeks gestation of pregnancy: Secondary | ICD-10-CM | POA: Diagnosis not present

## 2015-11-08 DIAGNOSIS — Z6791 Unspecified blood type, Rh negative: Secondary | ICD-10-CM

## 2015-11-08 DIAGNOSIS — Z791 Long term (current) use of non-steroidal anti-inflammatories (NSAID): Secondary | ICD-10-CM | POA: Diagnosis not present

## 2015-11-08 DIAGNOSIS — O99611 Diseases of the digestive system complicating pregnancy, first trimester: Secondary | ICD-10-CM | POA: Diagnosis not present

## 2015-11-08 HISTORY — DX: Unspecified osteoarthritis, unspecified site: M19.90

## 2015-11-08 HISTORY — DX: Major depressive disorder, single episode, unspecified: F32.9

## 2015-11-08 HISTORY — DX: Gastro-esophageal reflux disease without esophagitis: K21.9

## 2015-11-08 HISTORY — PX: DILATION AND EVACUATION: SHX1459

## 2015-11-08 HISTORY — DX: Depression, unspecified: F32.A

## 2015-11-08 LAB — TYPE AND SCREEN
ABO/RH(D): O NEG
Antibody Screen: NEGATIVE

## 2015-11-08 LAB — CBC
HCT: 36.5 % (ref 36.0–46.0)
HEMOGLOBIN: 12.7 g/dL (ref 12.0–15.0)
MCH: 28.2 pg (ref 26.0–34.0)
MCHC: 34.8 g/dL (ref 30.0–36.0)
MCV: 81.1 fL (ref 78.0–100.0)
PLATELETS: 420 10*3/uL — AB (ref 150–400)
RBC: 4.5 MIL/uL (ref 3.87–5.11)
RDW: 13 % (ref 11.5–15.5)
WBC: 14.5 10*3/uL — AB (ref 4.0–10.5)

## 2015-11-08 SURGERY — DILATION AND EVACUATION, UTERUS
Anesthesia: General | Site: Vagina

## 2015-11-08 MED ORDER — MIDAZOLAM HCL 2 MG/2ML IJ SOLN
INTRAMUSCULAR | Status: DC | PRN
  Administered 2015-11-08: 2 mg via INTRAVENOUS

## 2015-11-08 MED ORDER — PROPOFOL 10 MG/ML IV BOLUS
INTRAVENOUS | Status: AC
  Filled 2015-11-08: qty 20

## 2015-11-08 MED ORDER — PROMETHAZINE HCL 25 MG/ML IJ SOLN: 6.2500 mg | INTRAMUSCULAR | Status: DC | PRN

## 2015-11-08 MED ORDER — BUPIVACAINE-EPINEPHRINE 0.25% -1:200000 IJ SOLN
INTRAMUSCULAR | Status: DC | PRN
  Administered 2015-11-08: 30 mL

## 2015-11-08 MED ORDER — ONDANSETRON HCL 4 MG/2ML IJ SOLN
INTRAMUSCULAR | Status: AC
  Filled 2015-11-08: qty 2

## 2015-11-08 MED ORDER — HYDROMORPHONE HCL 1 MG/ML IJ SOLN
INTRAMUSCULAR | Status: AC
  Administered 2015-11-08: 0.5 mg via INTRAVENOUS
  Filled 2015-11-08: qty 1

## 2015-11-08 MED ORDER — PHENYLEPHRINE HCL 10 MG/ML IJ SOLN
INTRAMUSCULAR | Status: DC | PRN
  Administered 2015-11-08 (×2): 80 ug via INTRAVENOUS

## 2015-11-08 MED ORDER — LIDOCAINE HCL (CARDIAC) 20 MG/ML IV SOLN
INTRAVENOUS | Status: AC
  Filled 2015-11-08: qty 5

## 2015-11-08 MED ORDER — RHO D IMMUNE GLOBULIN 1500 UNIT/2ML IJ SOSY
300.0000 ug | PREFILLED_SYRINGE | Freq: Once | INTRAMUSCULAR | Status: AC
  Administered 2015-11-08: 300 ug via INTRAMUSCULAR
  Filled 2015-11-08: qty 2

## 2015-11-08 MED ORDER — HYDROMORPHONE HCL 1 MG/ML IJ SOLN
0.2500 mg | INTRAMUSCULAR | Status: DC | PRN
  Administered 2015-11-08 (×2): 0.5 mg via INTRAVENOUS

## 2015-11-08 MED ORDER — KETOROLAC TROMETHAMINE 30 MG/ML IJ SOLN
INTRAMUSCULAR | Status: DC | PRN
  Administered 2015-11-08: 30 mg via INTRAVENOUS

## 2015-11-08 MED ORDER — SCOPOLAMINE 1 MG/3DAYS TD PT72
1.0000 | MEDICATED_PATCH | Freq: Once | TRANSDERMAL | Status: DC
  Administered 2015-11-08: 1.5 mg via TRANSDERMAL

## 2015-11-08 MED ORDER — OXYCODONE-ACETAMINOPHEN 5-325 MG PO TABS
ORAL_TABLET | ORAL | Status: DC
Start: 2015-11-08 — End: 2015-11-08
  Filled 2015-11-08: qty 1

## 2015-11-08 MED ORDER — FENTANYL CITRATE (PF) 100 MCG/2ML IJ SOLN
INTRAMUSCULAR | Status: DC | PRN
  Administered 2015-11-08 (×2): 50 ug via INTRAVENOUS
  Administered 2015-11-08: 100 ug via INTRAVENOUS

## 2015-11-08 MED ORDER — PHENYLEPHRINE 40 MCG/ML (10ML) SYRINGE FOR IV PUSH (FOR BLOOD PRESSURE SUPPORT)
PREFILLED_SYRINGE | INTRAVENOUS | Status: AC
  Filled 2015-11-08: qty 10

## 2015-11-08 MED ORDER — AZITHROMYCIN 250 MG PO TABS
ORAL_TABLET | ORAL | Status: AC
  Administered 2015-11-08: 16:00:00
  Filled 2015-11-08: qty 4

## 2015-11-08 MED ORDER — LACTATED RINGERS IV SOLN
INTRAVENOUS | Status: DC
  Administered 2015-11-08: 13:00:00 via INTRAVENOUS
  Administered 2015-11-08: 125 mL/h via INTRAVENOUS

## 2015-11-08 MED ORDER — FENTANYL CITRATE (PF) 100 MCG/2ML IJ SOLN
INTRAMUSCULAR | Status: AC
  Filled 2015-11-08: qty 2

## 2015-11-08 MED ORDER — OXYCODONE-ACETAMINOPHEN 5-325 MG PO TABS
1.0000 | ORAL_TABLET | Freq: Once | ORAL | Status: AC
  Administered 2015-11-08: 1 via ORAL

## 2015-11-08 MED ORDER — SCOPOLAMINE 1 MG/3DAYS TD PT72
MEDICATED_PATCH | TRANSDERMAL | Status: AC
  Administered 2015-11-08: 1.5 mg via TRANSDERMAL
  Filled 2015-11-08: qty 1

## 2015-11-08 MED ORDER — AZITHROMYCIN 250 MG PO TABS: 1000.0000 mg | ORAL_TABLET | Freq: Once | ORAL | Status: DC

## 2015-11-08 MED ORDER — PROPOFOL 10 MG/ML IV BOLUS
INTRAVENOUS | Status: DC | PRN
  Administered 2015-11-08: 200 mg via INTRAVENOUS

## 2015-11-08 MED ORDER — MIDAZOLAM HCL 2 MG/2ML IJ SOLN
INTRAMUSCULAR | Status: AC
  Filled 2015-11-08: qty 2

## 2015-11-08 MED ORDER — BUPIVACAINE-EPINEPHRINE (PF) 0.25% -1:200000 IJ SOLN
INTRAMUSCULAR | Status: AC
  Filled 2015-11-08: qty 30

## 2015-11-08 MED ORDER — ONDANSETRON HCL 4 MG/2ML IJ SOLN
INTRAMUSCULAR | Status: DC | PRN
  Administered 2015-11-08: 4 mg via INTRAVENOUS

## 2015-11-08 MED ORDER — LIDOCAINE HCL (CARDIAC) 20 MG/ML IV SOLN
INTRAVENOUS | Status: DC | PRN
  Administered 2015-11-08: 50 mg via INTRAVENOUS

## 2015-11-08 SURGICAL SUPPLY — 18 items
CATH ROBINSON RED A/P 16FR (CATHETERS) ×3 IMPLANT
CLOTH BEACON ORANGE TIMEOUT ST (SAFETY) ×3 IMPLANT
DECANTER SPIKE VIAL GLASS SM (MISCELLANEOUS) ×3 IMPLANT
GLOVE BIOGEL PI IND STRL 7.0 (GLOVE) ×2 IMPLANT
GLOVE BIOGEL PI INDICATOR 7.0 (GLOVE) ×4
GLOVE ECLIPSE 7.0 STRL STRAW (GLOVE) ×6 IMPLANT
GOWN STRL REUS W/TWL LRG LVL3 (GOWN DISPOSABLE) ×9 IMPLANT
KIT BERKELEY 1ST TRIMESTER 3/8 (MISCELLANEOUS) ×3 IMPLANT
NS IRRIG 1000ML POUR BTL (IV SOLUTION) ×3 IMPLANT
PACK VAGINAL MINOR WOMEN LF (CUSTOM PROCEDURE TRAY) ×3 IMPLANT
PAD OB MATERNITY 4.3X12.25 (PERSONAL CARE ITEMS) ×3 IMPLANT
PAD PREP 24X48 CUFFED NSTRL (MISCELLANEOUS) ×3 IMPLANT
SET BERKELEY SUCTION TUBING (SUCTIONS) ×3 IMPLANT
TOWEL OR 17X24 6PK STRL BLUE (TOWEL DISPOSABLE) ×6 IMPLANT
VACURETTE 10 RIGID CVD (CANNULA) IMPLANT
VACURETTE 7MM CVD STRL WRAP (CANNULA) IMPLANT
VACURETTE 8 RIGID CVD (CANNULA) ×3 IMPLANT
VACURETTE 9 RIGID CVD (CANNULA) IMPLANT

## 2015-11-08 NOTE — Anesthesia Procedure Notes (Signed)
Procedure Name: LMA Insertion Date/Time: 11/08/2015 12:42 PM Performed by: Cephus ShellingBURGER, Yareli Carthen A Pre-anesthesia Checklist: Patient being monitored, Patient identified, Emergency Drugs available and Suction available Patient Re-evaluated:Patient Re-evaluated prior to inductionOxygen Delivery Method: Circle system utilized Preoxygenation: Pre-oxygenation with 100% oxygen Intubation Type: IV induction and Inhalational induction Ventilation: Mask ventilation without difficulty LMA: LMA inserted LMA Size: 4.0 Number of attempts: 1 Dental Injury: Teeth and Oropharynx as per pre-operative assessment

## 2015-11-08 NOTE — Transfer of Care (Signed)
Immediate Anesthesia Transfer of Care Note  Patient: Kendra FeltsChristina L Perkins  Procedure(s) Performed: Procedure(s): DILATATION AND EVACUATION (N/A)  Patient Location: PACU  Anesthesia Type:General  Level of Consciousness: sedated  Airway & Oxygen Therapy: Patient Spontanous Breathing  Post-op Assessment: Report given to RN  Post vital signs: Reviewed and stable  Last Vitals:  Vitals:   11/08/15 1058  BP: (!) 121/91  Pulse: 95  Resp: 16  Temp: 36.9 C    Last Pain:  Vitals:   11/08/15 1058  TempSrc: Oral      Patients Stated Pain Goal: 4 (11/08/15 1058)  Complications: No apparent anesthesia complications

## 2015-11-08 NOTE — H&P (Signed)
Preoperative History and Physical  Kendra Perkins is a 35 y.o. [phone] here for surgical management of missed AB with failure of cytotec in the outpatient setting.   No significant preoperative concerns.  Proposed surgery: Dilation and Evacuation  Past Medical History:  Diagnosis Date  . Arthritis    lower back  . Asthma    rarely uses inhaler  . Back pain 2000  . Depression    no meds  . Diabetes mellitus without complication (HCC)    borderline - no meds  . GERD (gastroesophageal reflux disease)    occasional - diet controlled   Past Surgical History:  Procedure Laterality Date  . APPENDECTOMY     ?. pt thinks she had it removed (laparoscopically)  . CESAREAN SECTION  2002, 2003   x 2  . WISDOM TOOTH EXTRACTION     OB History  Gravida Para Term Preterm AB Living  4 2   2 1 2   SAB TAB Ectopic Multiple Live Births  1       2    # Outcome Date GA Lbr Len/2nd Weight Sex Delivery Anes PTL Lv  4 Current           3 SAB 01/2015 [redacted]w[redacted]d         2 Preterm 03/10/01 [redacted]w[redacted]d  9 lb 8 oz (4.309 kg) M CS-LVertical   LIV  1 Preterm 02/02/00 [redacted]w[redacted]d  3 lb 15 oz (1.786 kg) F CS-LTranv   LIV    Obstetric Comments  Iatrogenic PTB: 32wks (pre-eclampsia), 36wk (patient states she was delivered early b/c the baby was too big)  Patient denies any other pertinent gynecologic issues.   No current facility-administered medications on file prior to encounter.    Current Outpatient Prescriptions on File Prior to Encounter  Medication Sig Dispense Refill  . naproxen (NAPROSYN) 500 MG tablet Take 500 mg by mouth 2 (two) times daily with a meal.     . albuterol (PROVENTIL HFA;VENTOLIN HFA) 108 (90 Base) MCG/ACT inhaler Inhale 2 puffs into the lungs every 6 (six) hours as needed for wheezing or shortness of breath.      Allergies  Allergen Reactions  . Other Itching and Other (See Comments)    Pt states that she is allergic to cats.   Reaction:  Sneezing     Social History:   reports that  she has been smoking Cigarettes.  She has a 15.00 pack-year smoking history. She has never used smokeless tobacco. She reports that she does not drink alcohol or use drugs.  Family History  Problem Relation Age of Onset  . Cancer Mother   . Diabetes Father   . Alcohol abuse Father   . Multiple sclerosis Daughter     Review of Systems: Noncontributory  PHYSICAL EXAM: Blood pressure (!) 121/91, pulse 95, temperature 98.4 F (36.9 C), temperature source Oral, resp. rate 16, height 5\' 6"  (1.676 m), weight 267 lb (121.1 kg), last menstrual period 08/01/2015, SpO2 98 %. CONSTITUTIONAL: Well-developed, well-nourished female in no acute distress.  HENT:  Normocephalic, atraumatic, External right and left ear normal. Oropharynx is clear and moist EYES: Conjunctivae and EOM are normal. Pupils are equal, round, and reactive to light. No scleral icterus.  NECK: Normal range of motion, supple, no masses SKIN: Skin is warm and dry. No rash noted. Not diaphoretic. No erythema. No pallor. NEUROLGIC: Alert and oriented to person, place, and time. Normal reflexes, muscle tone coordination. No cranial nerve deficit noted. PSYCHIATRIC: Normal mood and  affect. Normal behavior. Normal judgment and thought content. CARDIOVASCULAR: Normal heart rate noted, regular rhythm RESPIRATORY: Effort and breath sounds normal, no problems with respiration noted ABDOMEN: Soft, nontender, nondistended. PELVIC: Deferred MUSCULOSKELETAL: Normal range of motion. No edema and no tenderness. 2+ distal pulses.  Labs: Results for orders placed or performed in visit on 11/01/15 (from the past 336 hour(s))  Hemoglobin A1c   Collection Time: 11/01/15 11:04 AM  Result Value Ref Range   Hgb A1c MFr Bld 8.0 (H) <5.7 %   Mean Plasma Glucose 183 mg/dL    Imaging Studies: No results found.  Assessment: Patient Active Problem List   Diagnosis Date Noted  . Missed abortion 11/01/2015  . Supervision of high-risk pregnancy  10/02/2015  . History of pre-eclampsia in prior pregnancy, currently pregnant in first trimester 10/02/2015  . History of gestational diabetes mellitus (GDM) 10/02/2015  . History of cesarean delivery 10/02/2015  . BMI 40.0-44.9, adult (HCC) 10/02/2015  . Obesity in pregnancy 10/02/2015  . Tobacco abuse 10/02/2015  . Chronic low back pain 10/02/2015    Plan: Patient will undergo surgical management with D&E.   The risks of surgery were discussed in detail with the patient including but not limited to: bleeding which may require transfusion or reoperation; infection which may require antibiotics; perforation of uterus; need for additional procedures including laparoscopy or laparotomy; thromboembolic phenomenon, surgical site problems and other postoperative/anesthesia complications. Likelihood of success in alleviating the patient's condition was discussed. Routine postoperative instructions will be reviewed with the patient and her family in detail after surgery.  The patient concurred with the proposed plan, giving informed written consent for the surgery.  Patient has been NPO since last night she will remain NPO for procedure.  Anesthesia and OR aware.  Preoperative prophylactic antibiotics and SCDs ordered on call to the OR.  To OR when ready.  Federico FlakeKimberly Niles Newton, MD, MPH, ABFM Attending Physician Faculty Practice- [facility]  11/08/2015 11:02 AM

## 2015-11-08 NOTE — Discharge Instructions (Signed)
Dilation and Curettage or Vacuum Curettage, Care After °Refer to this sheet in the next few weeks. These instructions provide you with information on caring for yourself after your procedure. Your health care provider may also give you more specific instructions. Your treatment has been planned according to current medical practices, but problems sometimes occur. Call your health care provider if you have any problems or questions after your procedure. °WHAT TO EXPECT AFTER THE PROCEDURE °After your procedure, it is typical to have light cramping and bleeding. This may last for 2 days to 2 weeks after the procedure. °HOME CARE INSTRUCTIONS  °· Do not drive for 24 hours. °· Wait 1 week before returning to strenuous activities. °· Take your temperature 2 times a day for 4 days and write it down. Provide these temperatures to your health care provider if you develop a fever. °· Avoid long periods of standing. °· Avoid heavy lifting, pushing, or pulling. Do not lift anything heavier than 10 pounds (4.5 kg). °· Limit stair climbing to once or twice a day. °· Take rest periods often. °· You may resume your usual diet. °· Drink enough fluids to keep your urine clear or pale yellow. °· Your usual bowel function should return. If you have constipation, you may: °¨ Take a mild laxative with permission from your health care provider. °¨ Add fruit and bran to your diet. °¨ Drink more fluids. °· Take showers instead of baths until your health care provider gives you permission to take baths. °· Do not go swimming or use a hot tub until your health care provider approves. °· Try to have someone with you or available to you the first 24-48 hours, especially if you were given a general anesthetic. °· Do not douche, use tampons, or have sex (intercourse) for 2 weeks after the procedure. °· Only take over-the-counter or prescription medicines as directed by your health care provider. Do not take aspirin. It can cause  bleeding. °· Follow up with your health care provider as directed. °SEEK MEDICAL CARE IF:  °· You have increasing cramps or pain that is not relieved with medicine. °· You have abdominal pain that does not seem to be related to the same area of earlier cramping and pain. °· You have bad smelling vaginal discharge. °· You have a rash. °· You are having problems with any medicine. °SEEK IMMEDIATE MEDICAL CARE IF:  °· You have bleeding that is heavier than a normal menstrual period. °· You have a fever. °· You have chest pain. °· You have shortness of breath. °· You feel dizzy or feel like fainting. °· You pass out. °· You have pain in your shoulder strap area. °· You have heavy vaginal bleeding with or without blood clots. °MAKE SURE YOU:  °· Understand these instructions. °· Will watch your condition. °· Will get help right away if you are not doing well or get worse. °  °This information is not intended to replace advice given to you by your health care provider. Make sure you discuss any questions you have with your health care provider. °  °Document Released: 12/29/1999 Document Revised: 01/05/2013 Document Reviewed: 07/30/2012 °Elsevier Interactive Patient Education ©2016 Elsevier Inc. ° °Post Anesthesia Home Care Instructions ° °Activity: °Get plenty of rest for the remainder of the day. A responsible adult should stay with you for 24 hours following the procedure.  °For the next 24 hours, DO NOT: °-Drive a car °-Operate machinery °-Drink alcoholic beverages °-Take any medication unless   instructed by your physician °-Make any legal decisions or sign important papers. ° °Meals: °Start with liquid foods such as gelatin or soup. Progress to regular foods as tolerated. Avoid greasy, spicy, heavy foods. If nausea and/or vomiting occur, drink only clear liquids until the nausea and/or vomiting subsides. Call your physician if vomiting continues. ° °Special Instructions/Symptoms: °Your throat may feel dry or sore from  the anesthesia or the breathing tube placed in your throat during surgery. If this causes discomfort, gargle with warm salt water. The discomfort should disappear within 24 hours. ° °If you had a scopolamine patch placed behind your ear for the management of post- operative nausea and/or vomiting: ° °1. The medication in the patch is effective for 72 hours, after which it should be removed.  Wrap patch in a tissue and discard in the trash. Wash hands thoroughly with soap and water. °2. You may remove the patch earlier than 72 hours if you experience unpleasant side effects which may include dry mouth, dizziness or visual disturbances. °3. Avoid touching the patch. Wash your hands with soap and water after contact with the patch. °  ° °
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
## 2015-11-08 NOTE — Anesthesia Preprocedure Evaluation (Addendum)
Anesthesia Evaluation  Patient identified by MRN, date of birth, ID band Patient awake    Reviewed: Allergy & Precautions, NPO status , Patient's Chart, lab work & pertinent test results  History of Anesthesia Complications Negative for: history of anesthetic complications  Airway Mallampati: II  TM Distance: >3 FB Neck ROM: Full    Dental  (+) Dental Advisory Given, Poor Dentition   Pulmonary asthma , Current Smoker,    Pulmonary exam normal        Cardiovascular negative cardio ROS Normal cardiovascular exam     Neuro/Psych PSYCHIATRIC DISORDERS Depression negative neurological ROS     GI/Hepatic Neg liver ROS, GERD  ,  Endo/Other  diabetesMorbid obesity  Renal/GU negative Renal ROS     Musculoskeletal   Abdominal   Peds  Hematology   Anesthesia Other Findings   Reproductive/Obstetrics                            Anesthesia Physical Anesthesia Plan  ASA: III  Anesthesia Plan: General   Post-op Pain Management:    Induction: Intravenous  Airway Management Planned: Oral ETT and LMA  Additional Equipment:   Intra-op Plan:   Post-operative Plan: Extubation in OR  Informed Consent: I have reviewed the patients History and Physical, chart, labs and discussed the procedure including the risks, benefits and alternatives for the proposed anesthesia with the patient or authorized representative who has indicated his/her understanding and acceptance.   Dental advisory given  Plan Discussed with: CRNA and Anesthesiologist  Anesthesia Plan Comments:         Anesthesia Quick Evaluation

## 2015-11-08 NOTE — Brief Op Note (Signed)
11/08/2015  2:30 PM  PATIENT:  Kendra Perkins  35 y.o. female  PRE-OPERATIVE DIAGNOSIS:  8 wks missed AB  POST-OPERATIVE DIAGNOSIS:  8 wks missed AB  PROCEDURE:  Procedure(s): DILATATION AND EVACUATION (N/A)  SURGEON:  Surgeon(s) and Role:    * Federico FlakeKimberly Niles Viviann Broyles, MD - Assisting    * Tereso NewcomerUgonna A Anyanwu, MD - Primary  ANESTHESIA:   general  EBL:  Total I/O In: 1500 [I.V.:1500] Out: 150 [Urine:100; Blood:50]  BLOOD ADMINISTERED:none  DRAINS: none   LOCAL MEDICATIONS USED:  BUPIVICAINE   SPECIMEN:  Source of Specimen:  POC  DISPOSITION OF SPECIMEN:  PATHOLOGY  COUNTS:  YES  TOURNIQUET:  * No tourniquets in log *  DICTATION: .Note written in EPIC  PLAN OF CARE: Discharge to home after PACU  PATIENT DISPOSITION:  PACU - hemodynamically stable.   Delay start of Pharmacological VTE agent (>24hrs) due to surgical blood loss or risk of bleeding: yes

## 2015-11-09 ENCOUNTER — Encounter (HOSPITAL_COMMUNITY): Payer: Self-pay | Admitting: Obstetrics & Gynecology

## 2015-11-09 LAB — RH IG WORKUP (INCLUDES ABO/RH)
ABO/RH(D): O NEG
GESTATIONAL AGE(WKS): 8
Unit division: 0

## 2015-11-09 NOTE — Anesthesia Postprocedure Evaluation (Signed)
Anesthesia Post Note  Patient: Kendra Perkins  Procedure(s) Performed: Procedure(s) (LRB): DILATATION AND EVACUATION (N/A)  Anesthesia Post Evaluation   Last Vitals:  Vitals:   11/08/15 1445 11/08/15 1545  BP: 105/62 116/67  Pulse: 99 89  Resp: 19 20  Temp: 36.7 C 36.7 C    Last Pain:  Vitals:   11/08/15 1545  TempSrc:   PainSc: 4    Pain Goal: Patients Stated Pain Goal: 4 (11/08/15 1058)               Zanobia Griebel DANIEL

## 2015-11-29 ENCOUNTER — Ambulatory Visit: Payer: Medicaid Other | Admitting: Family Medicine

## 2016-08-06 ENCOUNTER — Encounter (HOSPITAL_COMMUNITY): Payer: Self-pay

## 2016-11-06 ENCOUNTER — Encounter (HOSPITAL_COMMUNITY): Payer: Self-pay | Admitting: Emergency Medicine

## 2016-11-06 ENCOUNTER — Emergency Department (HOSPITAL_COMMUNITY)
Admission: EM | Admit: 2016-11-06 | Discharge: 2016-11-06 | Disposition: A | Discharge status: Home / Self Care | Payer: No Typology Code available for payment source | Attending: Emergency Medicine | Admitting: Emergency Medicine

## 2016-11-06 DIAGNOSIS — M542 Cervicalgia: Secondary | ICD-10-CM | POA: Diagnosis not present

## 2016-11-06 DIAGNOSIS — Y9389 Activity, other specified: Secondary | ICD-10-CM | POA: Diagnosis not present

## 2016-11-06 DIAGNOSIS — Y998 Other external cause status: Secondary | ICD-10-CM | POA: Diagnosis not present

## 2016-11-06 DIAGNOSIS — Y929 Unspecified place or not applicable: Secondary | ICD-10-CM | POA: Diagnosis not present

## 2016-11-06 DIAGNOSIS — M549 Dorsalgia, unspecified: Secondary | ICD-10-CM | POA: Insufficient documentation

## 2016-11-06 LAB — POC URINE PREG, ED: Preg Test, Ur: NEGATIVE
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
## 2016-11-06 NOTE — ED Notes (Signed)
Pt brought back to hallway bed and stated to RN that she needed to leave to pick up her child so she had to leave. Ambulatory out of dept

## 2016-11-07 ENCOUNTER — Emergency Department (HOSPITAL_COMMUNITY)
Admission: EM | Admit: 2016-11-07 | Discharge: 2016-11-07 | Disposition: A | Discharge status: Home / Self Care | Payer: Medicaid Other | Attending: Emergency Medicine | Admitting: Emergency Medicine

## 2016-11-07 ENCOUNTER — Encounter (HOSPITAL_COMMUNITY): Payer: Self-pay | Admitting: *Deleted

## 2016-11-07 ENCOUNTER — Emergency Department (HOSPITAL_COMMUNITY): Payer: Medicaid Other

## 2016-11-07 DIAGNOSIS — Y999 Unspecified external cause status: Secondary | ICD-10-CM | POA: Insufficient documentation

## 2016-11-07 DIAGNOSIS — Y92410 Unspecified street and highway as the place of occurrence of the external cause: Secondary | ICD-10-CM | POA: Insufficient documentation

## 2016-11-07 DIAGNOSIS — S199XXA Unspecified injury of neck, initial encounter: Secondary | ICD-10-CM | POA: Diagnosis present

## 2016-11-07 DIAGNOSIS — Z79899 Other long term (current) drug therapy: Secondary | ICD-10-CM | POA: Insufficient documentation

## 2016-11-07 DIAGNOSIS — J45909 Unspecified asthma, uncomplicated: Secondary | ICD-10-CM | POA: Insufficient documentation

## 2016-11-07 DIAGNOSIS — E119 Type 2 diabetes mellitus without complications: Secondary | ICD-10-CM | POA: Diagnosis not present

## 2016-11-07 DIAGNOSIS — Y939 Activity, unspecified: Secondary | ICD-10-CM | POA: Insufficient documentation

## 2016-11-07 DIAGNOSIS — S134XXA Sprain of ligaments of cervical spine, initial encounter: Secondary | ICD-10-CM

## 2016-11-07 DIAGNOSIS — F17210 Nicotine dependence, cigarettes, uncomplicated: Secondary | ICD-10-CM | POA: Diagnosis not present
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
## 2016-11-07 NOTE — ED Notes (Signed)
Patient transported to X-ray 

## 2017-10-14 ENCOUNTER — Emergency Department (HOSPITAL_COMMUNITY): Payer: Medicaid Other

## 2017-10-14 ENCOUNTER — Encounter (HOSPITAL_COMMUNITY): Payer: Self-pay

## 2017-10-14 ENCOUNTER — Emergency Department (HOSPITAL_COMMUNITY)
Admission: EM | Admit: 2017-10-14 | Discharge: 2017-10-14 | Disposition: A | Discharge status: Home / Self Care | Payer: Medicaid Other | Attending: Emergency Medicine | Admitting: Emergency Medicine

## 2017-10-14 ENCOUNTER — Other Ambulatory Visit: Payer: Self-pay

## 2017-10-14 DIAGNOSIS — K625 Hemorrhage of anus and rectum: Secondary | ICD-10-CM

## 2017-10-14 DIAGNOSIS — R197 Diarrhea, unspecified: Secondary | ICD-10-CM | POA: Insufficient documentation

## 2017-10-14 DIAGNOSIS — N3000 Acute cystitis without hematuria: Secondary | ICD-10-CM | POA: Diagnosis not present

## 2017-10-14 DIAGNOSIS — E119 Type 2 diabetes mellitus without complications: Secondary | ICD-10-CM | POA: Diagnosis not present

## 2017-10-14 DIAGNOSIS — Z79899 Other long term (current) drug therapy: Secondary | ICD-10-CM | POA: Insufficient documentation

## 2017-10-14 DIAGNOSIS — F17210 Nicotine dependence, cigarettes, uncomplicated: Secondary | ICD-10-CM | POA: Insufficient documentation

## 2017-10-14 DIAGNOSIS — J45909 Unspecified asthma, uncomplicated: Secondary | ICD-10-CM | POA: Diagnosis not present

## 2017-10-14 DIAGNOSIS — R1032 Left lower quadrant pain: Secondary | ICD-10-CM | POA: Diagnosis present

## 2017-10-14 LAB — URINALYSIS, ROUTINE W REFLEX MICROSCOPIC
Bilirubin Urine: NEGATIVE
Ketones, ur: NEGATIVE mg/dL
Leukocytes, UA: NEGATIVE
NITRITE: POSITIVE — AB
Protein, ur: 100 mg/dL — AB
SPECIFIC GRAVITY, URINE: 1.03 (ref 1.005–1.030)
pH: 6 (ref 5.0–8.0)

## 2017-10-14 LAB — COMPREHENSIVE METABOLIC PANEL
ALBUMIN: 3 g/dL — AB (ref 3.5–5.0)
ALK PHOS: 124 U/L (ref 38–126)
ALT: 22 U/L (ref 0–44)
AST: 21 U/L (ref 15–41)
Anion gap: 14 (ref 5–15)
BILIRUBIN TOTAL: 0.3 mg/dL (ref 0.3–1.2)
BUN: 8 mg/dL (ref 6–20)
CALCIUM: 8.8 mg/dL — AB (ref 8.9–10.3)
CO2: 21 mmol/L — ABNORMAL LOW (ref 22–32)
CREATININE: 1 mg/dL (ref 0.44–1.00)
Chloride: 100 mmol/L (ref 98–111)
GFR calc Af Amer: >60 mL/min (ref >60)
GFR calc non Af Amer: >60 mL/min (ref >60)
Glucose, Bld: 293 mg/dL — ABNORMAL HIGH (ref 70–99)
Potassium: 3.5 mmol/L (ref 3.5–5.1)
Sodium: 135 mmol/L (ref 135–145)
TOTAL PROTEIN: 6.5 g/dL (ref 6.5–8.1)

## 2017-10-14 LAB — LIPASE, BLOOD: Lipase: 42 U/L (ref 11–51)

## 2017-10-14 LAB — CBC
HCT: 45.9 % (ref 36.0–46.0)
Hemoglobin: 15.5 g/dL — ABNORMAL HIGH (ref 12.0–15.0)
MCH: 28.2 pg (ref 26.0–34.0)
MCHC: 33.8 g/dL (ref 30.0–36.0)
MCV: 83.5 fL (ref 78.0–100.0)
Platelets: 441 10*3/uL — ABNORMAL HIGH (ref 150–400)
RBC: 5.5 MIL/uL — ABNORMAL HIGH (ref 3.87–5.11)
RDW: 13.1 % (ref 11.5–15.5)
WBC: 17.8 10*3/uL — ABNORMAL HIGH (ref 4.0–10.5)

## 2017-10-14 LAB — I-STAT BETA HCG BLOOD, ED (MC, WL, AP ONLY)

## 2017-10-14 LAB — POC OCCULT BLOOD, ED: FECAL OCCULT BLD: POSITIVE — AB

## 2017-10-14 LAB — ABO/RH: ABO/RH(D): O NEG

## 2017-10-14 MED ORDER — CEPHALEXIN 500 MG PO CAPS: 500.0000 mg | ORAL_CAPSULE | Freq: Three times a day (TID) | ORAL | 0 refills | Status: DC

## 2017-10-14 MED ORDER — DICYCLOMINE HCL 10 MG PO CAPS
10.0000 mg | ORAL_CAPSULE | Freq: Once | ORAL | Status: AC
  Administered 2017-10-14: 10 mg via ORAL
  Filled 2017-10-14: qty 1

## 2017-10-14 MED ORDER — HYDROCORTISONE 2.5 % RE CREA: TOPICAL_CREAM | RECTAL | 0 refills | Status: DC

## 2017-10-14 MED ORDER — IOHEXOL 300 MG/ML  SOLN
100.0000 mL | Freq: Once | INTRAMUSCULAR | Status: AC | PRN
  Administered 2017-10-14: 100 mL via INTRAVENOUS

## 2017-10-14 MED ORDER — ONDANSETRON HCL 4 MG/2ML IJ SOLN
4.0000 mg | Freq: Once | INTRAMUSCULAR | Status: AC
  Administered 2017-10-14: 4 mg via INTRAVENOUS
  Filled 2017-10-14: qty 2

## 2017-10-14 NOTE — ED Notes (Signed)
Patient continues to have lower abd pain started 2 days ago accompanying with diarrhea.

## 2017-10-14 NOTE — Discharge Instructions (Addendum)
You were seen today for concern for rectal bleeding lower abdominal pain.  Your work-up is largely reassuring including CT scan.  Your bleeding may be related to irritation or fissure of the anus.  Apply cream as directed.  You also were noted to have a UTI.  Take antibiotics as directed.  If you develop fever or worsening symptoms you should be reevaluated.

## 2017-10-14 NOTE — ED Provider Notes (Signed)
[facility] EMERGENCY DEPARTMENT Provider Note   CSN: [id number] Arrival date & time: 10/14/17  0208     History   Chief Complaint Chief Complaint  Patient presents with  . Abdominal Pain  . Rectal Bleeding    HPI Kendra Perkins is a 37 y.o. female.  HPI  This is a 37 year old female who presents with abdominal pain and rectal bleeding.  Patient reports 2-day history of rectal bleeding.  She states that she initially thought it was her period but she reports decreased flow and has still noted blood in the toilet.  She feels this is coming from her rectum.  She has had several days of diarrhea.  She reports nausea without vomiting.  No known history of rectal bleeding, IBD.  She does report some irritation given frequent bowel movements.  No overt rectal pain.  Describes abdominal pain as crampy and over the lower abdomen.  It is nonradiating.  It is currently 5 out of 10.  She has not taken anything for pain.  She denies any urinary symptoms but states "I keep a urinary tract infection."  Denies fevers or back pain.  Past Medical History:  Diagnosis Date  . Arthritis    lower back  . Asthma    rarely uses inhaler  . Back pain 2000  . Depression    no meds  . Diabetes mellitus without complication (HCC)    borderline - no meds  . GERD (gastroesophageal reflux disease)    occasional - diet controlled    Patient Active Problem List   Diagnosis Date Noted  . Type 2 diabetes mellitus without complication (HCC) 11/08/2015  . Rh negative state in antepartum period 11/08/2015  . Missed abortion 11/01/2015  . Supervision of high-risk pregnancy 10/02/2015  . History of pre-eclampsia in prior pregnancy, currently pregnant in first trimester 10/02/2015  . History of gestational diabetes mellitus (GDM) 10/02/2015  . History of cesarean delivery 10/02/2015  . BMI 40.0-44.9, adult (HCC) 10/02/2015  . Obesity in pregnancy 10/02/2015  . Tobacco abuse 10/02/2015   . Chronic low back pain 10/02/2015    Past Surgical History:  Procedure Laterality Date  . APPENDECTOMY     ?. pt thinks she had it removed (laparoscopically)  . CESAREAN SECTION  2002, 2003   x 2  . DILATION AND EVACUATION N/A 11/08/2015   Procedure: DILATATION AND EVACUATION;  Surgeon: Tereso Newcomer, MD;  Location: [facility];  Service: Gynecology;  Laterality: N/A;  . WISDOM TOOTH EXTRACTION       OB History    Gravida  4   Para  2   Term      Preterm  2   AB  1   Living  2     SAB  1   TAB      Ectopic      Multiple      Live Births  2        Obstetric Comments  Iatrogenic PTB: 32wks (pre-eclampsia), 36wk (patient states she was delivered early b/c the baby was too big)         Home Medications    Prior to Admission medications   Medication Sig Start Date End Date Taking? Authorizing Provider  albuterol (PROVENTIL HFA;VENTOLIN HFA) 108 (90 Base) MCG/ACT inhaler Inhale 2 puffs into the lungs every 6 (six) hours as needed for wheezing or shortness of breath.     [provider]  cephALEXin (KEFLEX) 500 MG capsule Take  1 capsule (500 mg total) by mouth 3 (three) times daily. 10/14/17   Horton, Mayer Masker, MD  hydrocortisone (ANUSOL-HC) 2.5 % rectal cream Apply rectally 2 times daily 10/14/17   Horton, Mayer Masker, MD  naproxen (NAPROSYN) 500 MG tablet Take 500 mg by mouth 2 (two) times daily with a meal.     [provider]  omeprazole (PRILOSEC) 40 MG capsule Take 40 mg by mouth as needed.    [provider]  traMADol (ULTRAM) 50 MG tablet Take 50 mg by mouth every 6 (six) hours as needed for moderate pain.    [provider]    Family History Family History  Problem Relation Age of Onset  . Cancer Mother   . Diabetes Father   . Alcohol abuse Father   . Multiple sclerosis Daughter     Social History Social History   Tobacco Use  . Smoking status: Current Every Day Smoker    Packs/day: 1.00    Years: 15.00     Pack years: 15.00    Types: Cigarettes  . Smokeless tobacco: Never Used  Substance Use Topics  . Alcohol use: No  . Drug use: No     Allergies   Other   Review of Systems Review of Systems  Constitutional: Negative for fever.  Respiratory: Negative for shortness of breath.   Cardiovascular: Negative for chest pain.  Gastrointestinal: Positive for abdominal pain, blood in stool, diarrhea and nausea. Negative for vomiting.  Genitourinary: Negative for dysuria.  All other systems reviewed and are negative.    Physical Exam Updated Vital Signs BP 116/84   Pulse 89   Temp 98.1 F (36.7 C) (Oral)   Resp 18   Ht 1.676 m (5\' 6" )   Wt 113.4 kg   LMP 10/14/2017   SpO2 97%   BMI 40.35 kg/m   Physical Exam  Constitutional: She is oriented to person, place, and time. She appears well-developed and well-nourished.  Obese  HENT:  Head: Normocephalic and atraumatic.  Neck: Neck supple.  Cardiovascular: Normal rate, regular rhythm and normal heart sounds.  Pulmonary/Chest: Effort normal. No respiratory distress. She has no wheezes.  Abdominal: Soft. Normal appearance and bowel sounds are normal. There is tenderness in the left lower quadrant. There is no rebound and no guarding.  Genitourinary:  Genitourinary Comments: Tenderness with insertion of the finger into the rectum, no gross blood, no obvious external hemorrhoids  Neurological: She is alert and oriented to person, place, and time.  Skin: Skin is warm and dry.  Psychiatric: She has a normal mood and affect.  Nursing note and vitals reviewed.    ED Treatments / Results  Labs (all labs ordered are listed, but only abnormal results are displayed) Labs Reviewed  COMPREHENSIVE METABOLIC PANEL - Abnormal; Notable for the following components:      Result Value   CO2 21 (*)    Glucose, Bld 293 (*)    Calcium 8.8 (*)    Albumin 3.0 (*)    All other components within normal limits  CBC - Abnormal; Notable for the  following components:   WBC 17.8 (*)    RBC 5.50 (*)    Hemoglobin 15.5 (*)    Platelets 441 (*)    All other components within normal limits  URINALYSIS, ROUTINE W REFLEX MICROSCOPIC - Abnormal; Notable for the following components:   Glucose, UA >=500 (*)    Hgb urine dipstick MODERATE (*)    Protein, ur 100 (*)  Nitrite POSITIVE (*)    Bacteria, UA FEW (*)    All other components within normal limits  POC OCCULT BLOOD, ED - Abnormal; Notable for the following components:   Fecal Occult Bld POSITIVE (*)    All other components within normal limits  URINE CULTURE  LIPASE, BLOOD  I-STAT BETA HCG BLOOD, ED ([facility], [facility], [facility] ONLY)  TYPE AND SCREEN  ABO/RH    EKG None  Radiology Ct Abdomen Pelvis W Contrast  Result Date: 10/14/2017 CLINICAL DATA:  Acute abdominal pain. Urinary frequency. Rectal bleeding. EXAM: CT ABDOMEN AND PELVIS WITH CONTRAST TECHNIQUE: Multidetector CT imaging of the abdomen and pelvis was performed using the standard protocol following bolus administration of intravenous contrast. CONTRAST:  OMNIPAQUE IOHEXOL 300 MG/ML  SOLN COMPARISON:  12/13/2011 FINDINGS: Lower chest: Lung bases are clear. Hepatobiliary: Liver is enlarged spanning 26 cm cranial caudal. Diffusely decreased density consistent with steatosis. No discrete hepatic lesion. Gallbladder near completely decompressed. No calcified gallstone. No biliary dilatation. Pancreas: No ductal dilatation or inflammation. Spleen: Normal in size without focal abnormality. Adrenals/Urinary Tract: Normal adrenal glands. No hydronephrosis or perinephric edema. Homogeneous renal enhancement. Urinary bladder is physiologically distended without wall thickening. Air within the nondependent bladder. Stomach/Bowel: Stomach distended with ingested contents. No small bowel wall thickening,, obstruction or inflammatory change. The appendix is not visualized. Mild diverticulosis of the distal descending colon without  diverticulitis. Vascular/Lymphatic: No enlarged abdominal or pelvic lymph nodes. No acute vascular findings. Reproductive: Uterus and bilateral adnexa are unremarkable. The ovaries are symmetric in size. Other: No free air, free fluid, or intra-abdominal fluid collection. Musculoskeletal: There are no acute or suspicious osseous abnormalities. IMPRESSION: 1. Air in the bladder lumen may be due to instrumentation. No secondary findings of bladder inflammation. 2. No other acute findings in the abdomen or pelvis. 3. Incidental findings of hepatomegaly with hepatic steatosis and mild colonic diverticulosis without diverticulitis. Electronically Signed   By: Narda Rutherford M.D.   On: 10/14/2017 06:08   Dg Abdomen Acute W/chest  Result Date: 10/14/2017 CLINICAL DATA:  Diffuse abdominal pain for 2 days.  Bloody diarrhea. EXAM: DG ABDOMEN ACUTE W/ 1V CHEST COMPARISON:  Chest radiograph December 03, 2006 FINDINGS: Cardiomediastinal silhouette is normal. Lungs are clear, no pleural effusions. No pneumothorax. Soft tissue planes and included osseous structures are unremarkable. Bowel gas pattern is nondilated and nonobstructive. Small to moderate amount of retained large bowel stool. Hepatomegaly extending past the iliac crest. No intra-abdominal pathologic calcifications or free air. Soft tissue planes and included osseous structures are non-suspicious. IMPRESSION: Normal chest. Normal bowel gas pattern.  Hepatomegaly. Electronically Signed   By: Awilda Metro M.D.   On: 10/14/2017 03:52    Procedures Procedures (including critical care time)  Medications Ordered in ED Medications  dicyclomine (BENTYL) capsule 10 mg (10 mg Oral Given 10/14/17 0335)  ondansetron (ZOFRAN) injection 4 mg (4 mg Intravenous Given 10/14/17 0501)  iohexol (OMNIPAQUE) 300 MG/ML solution 100 mL (100 mLs Intravenous Contrast Given 10/14/17 0540)     Initial Impression / Assessment and Plan / ED Course  I have reviewed the triage  vital signs and the nursing notes.  Pertinent labs & imaging results that were available during my care of the patient were reviewed by me and considered in my medical decision making (see chart for details).     Patient presents with abdominal pain and concern for rectal bleeding.  She is overall nontoxic-appearing.  She is obese.  She has some suprapubic and left lower  quadrant tenderness on exam.  Rectal exam is limited given her body habitus but no obvious bleeding hemorrhoids or fissure.  She did have pain with digital rectal exam.  No gross blood but Hemoccult was positive.  She actually appears hemoconcentrated.  Her hemoglobin is 15.  Given location of left lower quadrant pain, will obtain CT scan to rule out diverticulosis/diverticulitis.  Urinalysis is nitrite positive.  Urine culture was sent.  CT scan is largely unremarkable.  I discussed this with the patient.  Suspect that she may be having some slight rectal bleeding related to ongoing diarrhea and irritation or a an undetectable fissure.  Will give Anusol cream.  Additionally will treat for UTI.  After history, exam, and medical workup I feel the patient has been appropriately medically screened and is safe for discharge home. Pertinent diagnoses were discussed with the patient. Patient was given return precautions.   Final Clinical Impressions(s) / ED Diagnoses   Final diagnoses:  Acute cystitis without hematuria  Rectal bleeding    ED Discharge Orders         Ordered    cephALEXin (KEFLEX) 500 MG capsule  3 times daily     10/14/17 0639    hydrocortisone (ANUSOL-HC) 2.5 % rectal cream     10/14/17 0639           Shon Baton, MD 10/14/17 [phone]

## 2017-10-14 NOTE — ED Triage Notes (Signed)
Pt having urinary frequency and bleeding from the rectum for the last two days.  Today abdominal pain got worse. A&Ox4. Hx of Csection, had another surgery to the belly but cannot remember what.

## 2017-10-14 NOTE — ED Notes (Signed)
Patient transported to CT 

## 2017-10-15 LAB — TYPE AND SCREEN
ABO/RH(D): O NEG
ANTIBODY SCREEN: NEGATIVE

## 2017-10-17 LAB — URINE CULTURE

## 2017-10-18 ENCOUNTER — Telehealth: Payer: Self-pay

## 2017-10-18 NOTE — Telephone Encounter (Signed)
Post ED Visit - Positive Culture Follow-up  Culture report reviewed by antimicrobial stewardship pharmacist:  []  Enzo Bi, Pharm.D. []  Celedonio Miyamoto, [address].D., BCPS AQ-ID []  Garvin Fila, Pharm.D., BCPS []  Georgina Pillion, [address].D., BCPS []  [place], [address].D., BCPS, AAHIVP []  Estella Husk, Pharm.D., BCPS, AAHIVP []  Lysle Pearl, PharmD, BCPS []  Phillips Climes, PharmD, BCPS [x]  Agapito Games, PharmD, BCPS []  Verlan Friends, PharmD  Positive urine culture Treated with Cephalexin, organism sensitive to the same and no further patient follow-up is required at this time.  Jerry Caras 10/18/2017, 10:12 AM

## 2017-12-26 ENCOUNTER — Emergency Department (HOSPITAL_COMMUNITY): Payer: Medicaid Other

## 2017-12-26 ENCOUNTER — Emergency Department (HOSPITAL_COMMUNITY)
Admission: EM | Admit: 2017-12-26 | Discharge: 2017-12-27 | Disposition: A | Discharge status: Home / Self Care | Payer: Medicaid Other | Attending: Emergency Medicine | Admitting: Emergency Medicine

## 2017-12-26 ENCOUNTER — Other Ambulatory Visit: Payer: Self-pay

## 2017-12-26 DIAGNOSIS — R05 Cough: Secondary | ICD-10-CM | POA: Diagnosis not present

## 2017-12-26 DIAGNOSIS — E119 Type 2 diabetes mellitus without complications: Secondary | ICD-10-CM | POA: Diagnosis not present

## 2017-12-26 DIAGNOSIS — R0602 Shortness of breath: Secondary | ICD-10-CM | POA: Insufficient documentation

## 2017-12-26 DIAGNOSIS — F17210 Nicotine dependence, cigarettes, uncomplicated: Secondary | ICD-10-CM | POA: Insufficient documentation

## 2017-12-26 DIAGNOSIS — R0789 Other chest pain: Secondary | ICD-10-CM | POA: Insufficient documentation

## 2017-12-26 DIAGNOSIS — E669 Obesity, unspecified: Secondary | ICD-10-CM | POA: Diagnosis not present

## 2017-12-26 LAB — CBC WITH DIFFERENTIAL/PLATELET
ABS IMMATURE GRANULOCYTES: 0.1 10*3/uL — AB (ref 0.00–0.07)
Basophils Absolute: 0.1 10*3/uL (ref 0.0–0.1)
Basophils Relative: 1 %
Eosinophils Absolute: 0.2 10*3/uL (ref 0.0–0.5)
Eosinophils Relative: 2 %
HEMATOCRIT: 45.7 % (ref 36.0–46.0)
HEMOGLOBIN: 15.1 g/dL — AB (ref 12.0–15.0)
Immature Granulocytes: 1 %
LYMPHS PCT: 30 %
Lymphs Abs: 3.8 10*3/uL (ref 0.7–4.0)
MCH: 27.5 pg (ref 26.0–34.0)
MCHC: 33 g/dL (ref 30.0–36.0)
MCV: 83.1 fL (ref 80.0–100.0)
MONO ABS: 0.6 10*3/uL (ref 0.1–1.0)
MONOS PCT: 5 %
NEUTROS ABS: 7.9 10*3/uL — AB (ref 1.7–7.7)
Neutrophils Relative %: 61 %
Platelets: 358 10*3/uL (ref 150–400)
RBC: 5.5 MIL/uL — ABNORMAL HIGH (ref 3.87–5.11)
RDW: 12.6 % (ref 11.5–15.5)
WBC: 12.7 10*3/uL — ABNORMAL HIGH (ref 4.0–10.5)
nRBC: 0 % (ref 0.0–0.2)

## 2017-12-26 LAB — BASIC METABOLIC PANEL
Anion gap: 14 (ref 5–15)
BUN: 5 mg/dL — AB (ref 6–20)
CHLORIDE: 100 mmol/L (ref 98–111)
CO2: 22 mmol/L (ref 22–32)
Calcium: 9 mg/dL (ref 8.9–10.3)
Creatinine, Ser: 0.66 mg/dL (ref 0.44–1.00)
GFR calc Af Amer: >60 mL/min (ref >60)
Glucose, Bld: 320 mg/dL — ABNORMAL HIGH (ref 70–99)
POTASSIUM: 3.6 mmol/L (ref 3.5–5.1)
Sodium: 136 mmol/L (ref 135–145)

## 2017-12-26 MED ORDER — SODIUM CHLORIDE 0.9 % IV BOLUS
1000.0000 mL | Freq: Once | INTRAVENOUS | Status: AC
  Administered 2017-12-27: 1000 mL via INTRAVENOUS

## 2017-12-26 MED ORDER — METHYLPREDNISOLONE SODIUM SUCC 125 MG IJ SOLR
125.0000 mg | Freq: Once | INTRAMUSCULAR | Status: AC
  Administered 2017-12-26: 125 mg via INTRAVENOUS
  Filled 2017-12-26: qty 2

## 2017-12-26 MED ORDER — ALBUTEROL (5 MG/ML) CONTINUOUS INHALATION SOLN
10.0000 mg/h | INHALATION_SOLUTION | Freq: Once | RESPIRATORY_TRACT | Status: AC
  Administered 2017-12-26: 10 mg/h via RESPIRATORY_TRACT
  Filled 2017-12-26: qty 20

## 2017-12-26 MED ORDER — IPRATROPIUM-ALBUTEROL 0.5-2.5 (3) MG/3ML IN SOLN
3.0000 mL | Freq: Once | RESPIRATORY_TRACT | Status: AC
  Administered 2017-12-26: 3 mL via RESPIRATORY_TRACT
  Filled 2017-12-26: qty 3

## 2017-12-26 NOTE — ED Provider Notes (Addendum)
[facility] Provider Note   CSN: [id number] Arrival date & time: 12/26/17  1910     History   Chief Complaint Chief Complaint  Patient presents with  . Shortness of Breath    HPI Kendra Perkins is a 37 y.o. female with a past medical history of asthma, tobacco use smoking 2 packs/day for approximately 15 years, chronic pain, obesity, who presents today for evaluation of shortness of breath.  She reports that for approximately 1 week she has had nasal congestion with cough and worsening shortness of breath.  She initially went to urgent care who gave her 5 days of prednisone and an albuterol inhaler.  She reports that she is using the inhaler every hour without significant relief.  She says that her entire body hurts when she coughs.  She reports that her chest has started hurting when she coughs, however that was not initially present.  She reports sore throat worsened when she coughs.  She denies any fevers at home.  HPI  Past Medical History:  Diagnosis Date  . Arthritis    lower back  . Asthma    rarely uses inhaler  . Back pain 2000  . Depression    no meds  . Diabetes mellitus without complication (HCC)    borderline - no meds  . GERD (gastroesophageal reflux disease)    occasional - diet controlled    Patient Active Problem List   Diagnosis Date Noted  . Type 2 diabetes mellitus without complication (HCC) 11/08/2015  . Rh negative state in antepartum period 11/08/2015  . Missed abortion 11/01/2015  . Supervision of high-risk pregnancy 10/02/2015  . History of pre-eclampsia in prior pregnancy, currently pregnant in first trimester 10/02/2015  . History of gestational diabetes mellitus (GDM) 10/02/2015  . History of cesarean delivery 10/02/2015  . BMI 40.0-44.9, adult (HCC) 10/02/2015  . Obesity in pregnancy 10/02/2015  . Tobacco abuse 10/02/2015  . Chronic low back pain 10/02/2015    Past Surgical History:    Procedure Laterality Date  . APPENDECTOMY     ?. pt thinks she had it removed (laparoscopically)  . CESAREAN SECTION  2002, 2003   x 2  . DILATION AND EVACUATION N/A 11/08/2015   Procedure: DILATATION AND EVACUATION;  Surgeon: Tereso NewcomerUgonna A Anyanwu, MD;  Location: [facility];  Service: Gynecology;  Laterality: N/A;  . WISDOM TOOTH EXTRACTION       OB History    Gravida  4   Para  2   Term      Preterm  2   AB  1   Living  2     SAB  1   TAB      Ectopic      Multiple      Live Births  2        Obstetric Comments  Iatrogenic PTB: 32wks (pre-eclampsia), 36wk (patient states she was delivered early b/c the baby was too big)         Home Medications    Prior to Admission medications   Medication Sig Start Date End Date Taking? Authorizing Provider  albuterol (PROVENTIL HFA;VENTOLIN HFA) 108 (90 Base) MCG/ACT inhaler Inhale 2 puffs into the lungs every 6 (six) hours as needed for wheezing or shortness of breath.    Yes [provider]  benzonatate (TESSALON) 100 MG capsule Take 200 mg by mouth 3 (three) times daily as needed for cough.   Yes [provider]  cyclobenzaprine (  FLEXERIL) 10 MG tablet Take 5-10 mg by mouth 3 (three) times daily as needed for muscle spasms.  08/07/17  Yes [provider]  naproxen (NAPROSYN) 500 MG tablet Take 500 mg by mouth 2 (two) times daily with a meal.    Yes [provider]  omeprazole (PRILOSEC) 40 MG capsule Take 40 mg by mouth as needed (for reflux).    Yes [provider]  traMADol (ULTRAM) 50 MG tablet Take 50 mg by mouth every 6 (six) hours as needed for moderate pain.   Yes [provider]  cephALEXin (KEFLEX) 500 MG capsule Take 1 capsule (500 mg total) by mouth 3 (three) times daily. Patient not taking: Reported on 12/26/2017 10/14/17   Horton, Mayer Masker, MD  hydrocortisone (ANUSOL-HC) 2.5 % rectal cream Apply rectally 2 times daily Patient not taking: Reported on 12/26/2017  10/14/17   Horton, Mayer Masker, MD    Family History Family History  Problem Relation Age of Onset  . Cancer Mother   . Diabetes Father   . Alcohol abuse Father   . Multiple sclerosis Daughter     Social History Social History   Tobacco Use  . Smoking status: Current Every Day Smoker    Packs/day: 1.00    Years: 15.00    Pack years: 15.00    Types: Cigarettes  . Smokeless tobacco: Never Used  Substance Use Topics  . Alcohol use: No  . Drug use: No     Allergies   Other   Review of Systems Review of Systems  Constitutional: Negative for chills and fever.  HENT: Positive for sore throat.   Respiratory: Positive for cough, chest tightness and shortness of breath.   Cardiovascular: Positive for chest pain. Negative for palpitations and leg swelling.  Gastrointestinal: Positive for abdominal pain (Only when she coughs).  Neurological: Negative for headaches.     Physical Exam Updated Vital Signs BP 127/83   Pulse 89   Temp 97.9 F (36.6 C) (Oral)   Resp 16   SpO2 100%   Physical Exam Vitals signs and nursing note reviewed.  Constitutional:      General: She is not in acute distress.    Appearance: She is well-developed. She is obese. She is not toxic-appearing or diaphoretic.  HENT:     Head: Normocephalic and atraumatic.     Right Ear: External ear normal.     Left Ear: External ear normal.     Nose: Nose normal.     Mouth/Throat:     Mouth: Mucous membranes are moist.  Eyes:     General: No scleral icterus.       Right eye: No discharge.        Left eye: No discharge.     Conjunctiva/sclera: Conjunctivae normal.     Pupils: Pupils are equal, round, and reactive to light.  Neck:     Musculoskeletal: Normal range of motion and neck supple.     Trachea: No tracheal deviation.  Cardiovascular:     Rate and Rhythm: Normal rate and regular rhythm.     Heart sounds: Normal heart sounds. No murmur. No friction rub. No gallop.   Pulmonary:     Effort:  Pulmonary effort is normal. No tachypnea, accessory muscle usage or respiratory distress.     Breath sounds: No stridor. Examination of the right-upper field reveals wheezing. Examination of the left-upper field reveals wheezing. Examination of the right-middle field reveals wheezing. Examination of the left-middle field reveals wheezing. Examination  of the right-lower field reveals wheezing. Examination of the left-lower field reveals wheezing. Wheezing present. No decreased breath sounds or rhonchi.  Chest:     Chest wall: Tenderness (Palpation over anterior chest wall/sternal costal joints re-creates and exacerbates her reported chest pain exactly.) present.  Abdominal:     General: Bowel sounds are normal. There is no distension.     Palpations: Abdomen is soft.     Tenderness: There is no abdominal tenderness. There is no guarding.  Musculoskeletal:        General: No deformity.     Right lower leg: She exhibits no tenderness. No edema.     Left lower leg: She exhibits no tenderness. No edema.  Skin:    General: Skin is warm and dry.  Neurological:     Mental Status: She is alert.     Cranial Nerves: No cranial nerve deficit.     Sensory: No sensory deficit.     Motor: No abnormal muscle tone.  Psychiatric:        Behavior: Behavior normal.      ED Treatments / Results  Labs (all labs ordered are listed, but only abnormal results are displayed) Labs Reviewed  CBC WITH DIFFERENTIAL/PLATELET - Abnormal; Notable for the following components:      Result Value   WBC 12.7 (*)    RBC 5.50 (*)    Hemoglobin 15.1 (*)    Neutro Abs 7.9 (*)    Abs Immature Granulocytes 0.10 (*)    All other components within normal limits  BASIC METABOLIC PANEL    EKG None  Radiology Dg Chest 2 View  Result Date: 12/26/2017 CLINICAL DATA:  Cough for over 1 week. EXAM: CHEST - 2 VIEW COMPARISON:  Single-view of the chest 10/14/2017. PA and lateral chest 10/03/2006. FINDINGS: The lungs are  clear. Heart size is normal. No pneumothorax or pleural fluid. No acute or focal bony abnormality. IMPRESSION: Negative chest. Electronically Signed   By: Drusilla Kanner M.D.   On: 12/26/2017 19:56    Procedures Procedures (including critical care time)  Medications Ordered in ED Medications  ipratropium-albuterol (DUONEB) 0.5-2.5 (3) MG/3ML nebulizer solution 3 mL (3 mLs Nebulization Given 12/26/17 2156)  methylPREDNISolone sodium succinate (SOLU-MEDROL) 125 mg/2 mL injection 125 mg (125 mg Intravenous Given 12/26/17 2246)  albuterol (PROVENTIL,VENTOLIN) solution continuous neb (10 mg/hr Nebulization Given 12/26/17 2230)     Initial Impression / Assessment and Plan / ED Course  I have reviewed the triage vital signs and the nursing notes.  Pertinent labs & imaging results that were available during my care of the patient were reviewed by me and considered in my medical decision making (see chart for details).    Patient presents today for evaluation of shortness of breath.  She has had cold-like symptoms for approximately 1 week, was treated with prednisone without significant improvement.  She has been using an albuterol inhaler, however still is short of breath.  Upon my initial evaluation she had generalized wheezes.  She was given a DuoNeb which did not provide significant relief.  Chest x-ray does not show evidence of pneumonia or other consolidation.  Hour-long albuterol treatment and IV steroids were ordered.  Her chest pain started after she had been coughing, therefore suspect that it is musculoskeletal in nature especially as it is reproduced with palpation of the chest.  EKG was obtained.  Given the diffuse nature of her wheezing in the setting of known asthmatic history and exam is not consistent with  PE.  Basic labs have been ordered.    At shift change care was transferred to [facility] who will follow pending studies, re-evaulate and determine disposition.     Final  Clinical Impressions(s) / ED Diagnoses   Final diagnoses:  None    ED Discharge Orders    None           Norman Clay 12/26/17 2321    Tilden Fossa, MD 12/26/17 [phone]

## 2017-12-26 NOTE — ED Provider Notes (Signed)
37 [facility] old [facility] after IV Solu-Medrol and hour-long nebulizer and labs.  "Kendra FeltsChristina L Buskirk is a 37 y.o. female with a past medical history of asthma, tobacco use smoking 2 packs/day for approximately 15 years, chronic pain, obesity, who presents today for evaluation of shortness of breath.  She reports that for approximately 1 week she has had nasal congestion with cough and worsening shortness of breath.  She initially went to urgent care who gave her 5 days of prednisone and an albuterol inhaler.  She reports that she is using the inhaler every hour without significant relief.  She says that her entire body hurts when she coughs.  She reports that her chest has started hurting when she coughs, however that was not initially present.  She reports sore throat worsened when she coughs.  She denies any fevers at home."  Physical Exam  BP 126/74   Pulse (!) 114   Temp 97.9 F (36.6 C) (Oral)   Resp (!) 22   SpO2 99%   Physical Exam  Obese, barrel chested female.  Appears much older than stated age.  Inspiratory and expiratory wheezes noted bilaterally in all fields.  No accessory muscle use, retractions, or nasal flaring.  Able to speak in complete, fluent sentences.   ED Course/Procedures     Procedures  MDM   37 year old with a h/o asthma, tobacco use smoking 2 packs/day for approximately 15 years, chronic pain, obesity [facility] labs and reevaluation after IV Solu-Medrol and hour-long nebulizer.  Mild leukocytosis of 12.7, likely secondary to corticosteroid use as the patient was previously on a 4-day steroid burst.  Glucose is 320, but anion gap is normal at 14 and bicarb is normal.  Will give 1 L of normal saline and recheck CBG.  Following 1 L of IV fluids, glucose was rechecked and is 395.  The patient states that she is on home insulin, but has not been compliant with taking it over the  last few days.  She also has a glucometer at home.  She is established with primary care.  I personally ambulated the patient on pulse ox and she remained at 97 to 100% with good waveform.  She was able to talk in complete, fluent sentences as we ambulated for several 100 feet throughout the entire pod.  Discussed smoking cessation with patient and was they were offerred resources to help stop.  Total time was 5 min CPT code [id number].    Repeat CBG is 395.  The patient and I had a long discussion regarding her chronic smoking.  Discussed that as long as she keeps smoking 2 packs a day that her symptoms will not improve.  After hour-long nebulizer, her respiratory status has markedly improved.  She does not meet admission criteria based on her respiratory status.  Although repeat CBG is elevated, this is likely secondary to IV Solu-Medrol.  The patient does need to go home on a course of steroids, and we discussed at length the importance of taking her home insulin and checking her blood sugar since she will be on a course of prednisone.  She acknowledged this information and all questions were answered.  She will also be given cough medicine.  I advised her to follow-up with primary care for recheck in 2 to 3 days or return to the ER if her breathing worsens or she develops new symptoms.  She is hemodynamically stable at  this time.  No DKA or HHS.  I suspect she has undiagnosed COPD that may require further outpatient work-up.  The patient was discussed with Dr. Senaida Ores, attending physician.  She is hemodynamically stable and in no acute distress.  She is safe for discharge home with outpatient follow-up at this time.       Frederik Pear A, PA-C 12/27/17 1610    Gilda Crease, MD 12/28/17 [phone]

## 2017-12-26 NOTE — ED Triage Notes (Signed)
Pt presents to ED from home after URI symptoms over a week. Was seen at urgent care at the beginning of the week and given steroids and cough medication. Has used her inhaler but still feels shob and has continued dry cough, no fevers.

## 2017-12-27 LAB — CBG MONITORING, ED: Glucose-Capillary: 395 mg/dL — ABNORMAL HIGH (ref 70–99)

## 2017-12-27 MED ORDER — PREDNISONE 10 MG (21) PO TBPK: ORAL_TABLET | Freq: Every day | ORAL | 0 refills | Status: DC

## 2017-12-27 MED ORDER — AEROCHAMBER PLUS FLO-VU SMALL MISC
1.0000 | Freq: Once | Status: AC
  Administered 2017-12-27: 1
  Filled 2017-12-27: qty 1

## 2017-12-27 MED ORDER — ALBUTEROL SULFATE HFA 108 (90 BASE) MCG/ACT IN AERS
2.0000 | INHALATION_SPRAY | RESPIRATORY_TRACT | Status: DC
  Administered 2017-12-27: 2 via RESPIRATORY_TRACT
  Filled 2017-12-27: qty 6.7

## 2017-12-27 MED ORDER — HYDROCODONE-ACETAMINOPHEN 7.5-325 MG/15ML PO SOLN: 10.0000 mL | Freq: Four times a day (QID) | ORAL | 0 refills | Status: DC | PRN

## 2017-12-27 NOTE — ED Notes (Signed)
Pt feeling a little better.

## 2017-12-27 NOTE — Discharge Instructions (Addendum)
Thank you for allowing me to care for you today in the Emergency Department.   It is very important that you do not skip any doses of your home insulin.  You want to keep good control of your blood sugar while you are taking prednisone because this medication can cause your blood sugar to go up.  Starting tomorrow: take 6 tabs by mouth daily  for 2 days, then 5 tabs for 2 days, then 4 tabs for 2 days, then 3 tabs for 2 days, 2 tabs for 2 days, then 1 tab by mouth daily for 2 days.  Use 2 puffs of your albuterol inhaler with a spacer every 4 hours as needed for shortness of breath.  You can take 10 mL's of hydrocodone acetaminophen cough syrup every 6 hours as needed.  Do not work or drive while taking this medication because it has a narcotic in it.  Please follow-up with your primary care provider for recheck on Monday or Tuesday.  Continue to check your blood sugar at home regularly.   If you develop difficulty breathing, gasping for breath, or other new, concerning symptoms, please return to the emergency department for reevaluation.

## 2019-07-30 ENCOUNTER — Other Ambulatory Visit: Payer: Self-pay

## 2019-07-30 DIAGNOSIS — M79642 Pain in left hand: Secondary | ICD-10-CM | POA: Insufficient documentation

## 2019-07-30 DIAGNOSIS — Z5321 Procedure and treatment not carried out due to patient leaving prior to being seen by health care provider: Secondary | ICD-10-CM | POA: Insufficient documentation

## 2019-07-30 DIAGNOSIS — R2232 Localized swelling, mass and lump, left upper limb: Secondary | ICD-10-CM | POA: Insufficient documentation

## 2019-07-30 LAB — COMPREHENSIVE METABOLIC PANEL
ALT: 17 U/L (ref 0–44)
AST: 14 U/L — ABNORMAL LOW (ref 15–41)
Albumin: 3.2 g/dL — ABNORMAL LOW (ref 3.5–5.0)
Alkaline Phosphatase: 118 U/L (ref 38–126)
Anion gap: 9 (ref 5–15)
BUN: 14 mg/dL (ref 6–20)
CO2: 25 mmol/L (ref 22–32)
Calcium: 9.2 mg/dL (ref 8.9–10.3)
Chloride: 100 mmol/L (ref 98–111)
Creatinine, Ser: 0.82 mg/dL (ref 0.44–1.00)
GFR calc Af Amer: >60 mL/min (ref >60)
GFR calc non Af Amer: >60 mL/min (ref >60)
Glucose, Bld: 300 mg/dL — ABNORMAL HIGH (ref 70–99)
Potassium: 4.3 mmol/L (ref 3.5–5.1)
Sodium: 134 mmol/L — ABNORMAL LOW (ref 135–145)
Total Bilirubin: 0.5 mg/dL (ref 0.3–1.2)
Total Protein: 7 g/dL (ref 6.5–8.1)

## 2019-07-30 LAB — CBC WITH DIFFERENTIAL/PLATELET
Abs Immature Granulocytes: 0.06 10*3/uL (ref 0.00–0.07)
Basophils Absolute: 0.1 10*3/uL (ref 0.0–0.1)
Basophils Relative: 1 %
Eosinophils Absolute: 0.3 10*3/uL (ref 0.0–0.5)
Eosinophils Relative: 2 %
HCT: 42.4 % (ref 36.0–46.0)
Hemoglobin: 14.7 g/dL (ref 12.0–15.0)
Immature Granulocytes: 1 %
Lymphocytes Relative: 37 %
Lymphs Abs: 4.7 10*3/uL — ABNORMAL HIGH (ref 0.7–4.0)
MCH: 27.9 pg (ref 26.0–34.0)
MCHC: 34.7 g/dL (ref 30.0–36.0)
MCV: 80.6 fL (ref 80.0–100.0)
Monocytes Absolute: 0.5 10*3/uL (ref 0.1–1.0)
Monocytes Relative: 4 %
Neutro Abs: 7 10*3/uL (ref 1.7–7.7)
Neutrophils Relative %: 55 %
Platelets: 467 10*3/uL — ABNORMAL HIGH (ref 150–400)
RBC: 5.26 MIL/uL — ABNORMAL HIGH (ref 3.87–5.11)
RDW: 13 % (ref 11.5–15.5)
WBC: 12.6 10*3/uL — ABNORMAL HIGH (ref 4.0–10.5)
nRBC: 0 % (ref 0.0–0.2)

## 2019-07-30 NOTE — ED Triage Notes (Signed)
Patient c/o pain/swelling to thumb left hand. Patient on antibiotics X 4 days for infection. Patient reports symptoms are worsening.

## 2019-07-31 ENCOUNTER — Emergency Department: Payer: Medicaid Other

## 2019-07-31 ENCOUNTER — Other Ambulatory Visit: Payer: Self-pay

## 2019-07-31 ENCOUNTER — Emergency Department
Admission: EM | Admit: 2019-07-31 | Discharge: 2019-07-31 | Disposition: A | Discharge status: Home / Self Care | Payer: Medicaid Other | Source: Home / Self Care | Attending: Emergency Medicine | Admitting: Emergency Medicine

## 2019-07-31 ENCOUNTER — Emergency Department
Admission: EM | Admit: 2019-07-31 | Discharge: 2019-07-31 | Disposition: A | Discharge status: Home / Self Care | Payer: Medicaid Other | Attending: Emergency Medicine | Admitting: Emergency Medicine

## 2019-07-31 DIAGNOSIS — L03012 Cellulitis of left finger: Secondary | ICD-10-CM | POA: Insufficient documentation

## 2019-07-31 DIAGNOSIS — E119 Type 2 diabetes mellitus without complications: Secondary | ICD-10-CM | POA: Insufficient documentation

## 2019-07-31 DIAGNOSIS — F17210 Nicotine dependence, cigarettes, uncomplicated: Secondary | ICD-10-CM | POA: Insufficient documentation

## 2019-07-31 DIAGNOSIS — J45909 Unspecified asthma, uncomplicated: Secondary | ICD-10-CM | POA: Insufficient documentation

## 2019-07-31 DIAGNOSIS — L03019 Cellulitis of unspecified finger: Secondary | ICD-10-CM

## 2019-07-31 LAB — CBC WITH DIFFERENTIAL/PLATELET
Abs Immature Granulocytes: 0.05 10*3/uL (ref 0.00–0.07)
Basophils Absolute: 0.1 10*3/uL (ref 0.0–0.1)
Basophils Relative: 1 %
Eosinophils Absolute: 0.3 10*3/uL (ref 0.0–0.5)
Eosinophils Relative: 2 %
HCT: 42.3 % (ref 36.0–46.0)
Hemoglobin: 15.2 g/dL — ABNORMAL HIGH (ref 12.0–15.0)
Immature Granulocytes: 0 %
Lymphocytes Relative: 40 %
Lymphs Abs: 4.4 10*3/uL — ABNORMAL HIGH (ref 0.7–4.0)
MCH: 27.9 pg (ref 26.0–34.0)
MCHC: 35.9 g/dL (ref 30.0–36.0)
MCV: 77.6 fL — ABNORMAL LOW (ref 80.0–100.0)
Monocytes Absolute: 0.6 10*3/uL (ref 0.1–1.0)
Monocytes Relative: 5 %
Neutro Abs: 5.8 10*3/uL (ref 1.7–7.7)
Neutrophils Relative %: 52 %
Platelets: 432 10*3/uL — ABNORMAL HIGH (ref 150–400)
RBC: 5.45 MIL/uL — ABNORMAL HIGH (ref 3.87–5.11)
RDW: 13.3 % (ref 11.5–15.5)
WBC: 11.2 10*3/uL — ABNORMAL HIGH (ref 4.0–10.5)
nRBC: 0 % (ref 0.0–0.2)

## 2019-07-31 LAB — COMPREHENSIVE METABOLIC PANEL
ALT: 16 U/L (ref 0–44)
AST: 15 U/L (ref 15–41)
Albumin: 3.4 g/dL — ABNORMAL LOW (ref 3.5–5.0)
Alkaline Phosphatase: 113 U/L (ref 38–126)
Anion gap: 11 (ref 5–15)
BUN: 17 mg/dL (ref 6–20)
CO2: 23 mmol/L (ref 22–32)
Calcium: 9.2 mg/dL (ref 8.9–10.3)
Chloride: 100 mmol/L (ref 98–111)
Creatinine, Ser: 0.52 mg/dL (ref 0.44–1.00)
GFR calc Af Amer: >60 mL/min (ref >60)
GFR calc non Af Amer: >60 mL/min (ref >60)
Glucose, Bld: 295 mg/dL — ABNORMAL HIGH (ref 70–99)
Potassium: 4.2 mmol/L (ref 3.5–5.1)
Sodium: 134 mmol/L — ABNORMAL LOW (ref 135–145)
Total Bilirubin: 0.4 mg/dL (ref 0.3–1.2)
Total Protein: 7.2 g/dL (ref 6.5–8.1)

## 2019-07-31 MED ORDER — ONDANSETRON HCL 4 MG/2ML IJ SOLN
4.0000 mg | Freq: Once | INTRAMUSCULAR | Status: AC
  Administered 2019-07-31: 4 mg via INTRAVENOUS
  Filled 2019-07-31: qty 2

## 2019-07-31 MED ORDER — LIDOCAINE HCL (PF) 1 % IJ SOLN
5.0000 mL | Freq: Once | INTRAMUSCULAR | Status: AC
  Administered 2019-07-31: 5 mL via INTRADERMAL
  Filled 2019-07-31: qty 5

## 2019-07-31 MED ORDER — DOXYCYCLINE HYCLATE 100 MG PO TABS
100.0000 mg | ORAL_TABLET | Freq: Once | ORAL | Status: AC
  Administered 2019-07-31: 100 mg via ORAL
  Filled 2019-07-31: qty 1

## 2019-07-31 MED ORDER — SODIUM CHLORIDE 0.9 % IV BOLUS
1000.0000 mL | Freq: Once | INTRAVENOUS | Status: AC
  Administered 2019-07-31: 1000 mL via INTRAVENOUS

## 2019-07-31 MED ORDER — DOXYCYCLINE HYCLATE 100 MG PO CAPS
100.0000 mg | ORAL_CAPSULE | Freq: Two times a day (BID) | ORAL | 0 refills | Status: DC
Start: 2019-07-31 — End: 2019-08-11

## 2019-07-31 MED ORDER — FENTANYL CITRATE (PF) 100 MCG/2ML IJ SOLN
50.0000 ug | Freq: Once | INTRAMUSCULAR | Status: AC
  Administered 2019-07-31: 50 ug via INTRAVENOUS
  Filled 2019-07-31: qty 2

## 2019-07-31 MED ORDER — CEFAZOLIN SODIUM-DEXTROSE 1-4 GM/50ML-% IV SOLN
1.0000 g | Freq: Once | INTRAVENOUS | Status: AC
  Administered 2019-07-31: 1 g via INTRAVENOUS
  Filled 2019-07-31: qty 50

## 2019-07-31 MED ORDER — BACITRACIN-NEOMYCIN-POLYMYXIN 400-5-5000 EX OINT
TOPICAL_OINTMENT | Freq: Once | CUTANEOUS | Status: AC
  Filled 2019-07-31: qty 2

## 2019-07-31 MED ORDER — HYDROCODONE-ACETAMINOPHEN 5-325 MG PO TABS: 1.0000 | ORAL_TABLET | Freq: Four times a day (QID) | ORAL | 0 refills | Status: DC | PRN

## 2019-07-31 MED ORDER — MORPHINE SULFATE (PF) 4 MG/ML IV SOLN
4.0000 mg | Freq: Once | INTRAVENOUS | Status: AC
  Administered 2019-07-31: 4 mg via INTRAVENOUS
  Filled 2019-07-31: qty 1

## 2019-07-31 MED ORDER — BUPIVACAINE HCL 0.5 % IJ SOLN
10.0000 mL | Freq: Once | INTRAMUSCULAR | Status: AC
  Administered 2019-07-31: 10 mL
  Filled 2019-07-31: qty 10

## 2019-07-31 MED ORDER — DOXYCYCLINE HYCLATE 100 MG PO CAPS
100.0000 mg | ORAL_CAPSULE | Freq: Two times a day (BID) | ORAL | 0 refills | Status: DC
Start: 2019-07-31 — End: 2019-07-31

## 2019-07-31 MED ORDER — FLUCONAZOLE 150 MG PO TABS
ORAL_TABLET | ORAL | 0 refills | Status: DC
Start: 2019-07-31 — End: 2019-08-07

## 2019-07-31 MED ORDER — SODIUM CHLORIDE 0.9 % IV SOLN
2.0000 g | Freq: Once | INTRAVENOUS | Status: AC
  Administered 2019-07-31: 2 g via INTRAVENOUS
  Filled 2019-07-31: qty 20

## 2019-07-31 MED ORDER — CEPHALEXIN 500 MG PO CAPS
500.0000 mg | ORAL_CAPSULE | Freq: Three times a day (TID) | ORAL | 0 refills | Status: DC
Start: 2019-07-31 — End: 2019-08-07

## 2019-07-31 MED ORDER — KETOROLAC TROMETHAMINE 30 MG/ML IJ SOLN
15.0000 mg | Freq: Once | INTRAMUSCULAR | Status: AC
  Administered 2019-07-31: 15 mg via INTRAVENOUS
  Filled 2019-07-31: qty 1

## 2019-07-31 NOTE — ED Triage Notes (Signed)
Seen and d/c earlier. Pain in thumb getting worse and pt reports that she has to stay and get the antibiotics.

## 2019-07-31 NOTE — ED Triage Notes (Signed)
Pt comes POV with pain/swelling/redness to left thumb. Pt was here yesterday and LWBS. Pt has been on antibiotics. Has not finished them yet. Reprots it's getting worse. Had bloodwork done here yesterday.

## 2019-07-31 NOTE — ED Provider Notes (Signed)
[facility] Emergency Department Provider Note  ____________________________________________   First MD Initiated Contact with Patient 07/31/19 1137     (approximate)  I have reviewed the triage vital signs and the nursing notes.   HISTORY  Chief Complaint Hand Pain    HPI Kendra Perkins is a 39 y.o. female presents emergency department complaining of left thumb pain.  Patient states that she was seen at [facility] last Monday and given Septra for the infection on her finger.  She states it was more swollen on the top at that time.  Now she can see pus underneath the fingernail and is having swelling to the pad of the finger.  Patient has history of diabetes.  She was here last night but left without being seen, patient states she has not had fever or chills.  She does feel like the infection has gone deeper and is more painful.   Past Medical History:  Diagnosis Date  . Arthritis    lower back  . Asthma    rarely uses inhaler  . Back pain 2000  . Depression    no meds  . Diabetes mellitus without complication (HCC)    borderline - no meds  . GERD (gastroesophageal reflux disease)    occasional - diet controlled    Patient Active Problem List   Diagnosis Date Noted  . Type 2 diabetes mellitus without complication (HCC) 11/08/2015  . Rh negative state in antepartum period 11/08/2015  . Missed abortion 11/01/2015  . Supervision of high-risk pregnancy 10/02/2015  . History of pre-eclampsia in prior pregnancy, currently pregnant in first trimester 10/02/2015  . History of gestational diabetes mellitus (GDM) 10/02/2015  . History of cesarean delivery 10/02/2015  . BMI 40.0-44.9, adult (HCC) 10/02/2015  . Obesity in pregnancy 10/02/2015  . Tobacco abuse 10/02/2015  . Chronic low back pain 10/02/2015    Past Surgical History:  Procedure Laterality Date  . APPENDECTOMY     ?. pt thinks she had it removed (laparoscopically)  .  CESAREAN SECTION  2002, 2003   x 2  . DILATION AND EVACUATION N/A 11/08/2015   Procedure: DILATATION AND EVACUATION;  Surgeon: Tereso Newcomer, MD;  Location: [facility] ORS;  Service: Gynecology;  Laterality: N/A;  . WISDOM TOOTH EXTRACTION      Prior to Admission medications   Medication Sig Start Date End Date Taking? Authorizing Provider  albuterol (PROVENTIL HFA;VENTOLIN HFA) 108 (90 Base) MCG/ACT inhaler Inhale 2 puffs into the lungs every 6 (six) hours as needed for wheezing or shortness of breath.     [provider]  cephALEXin (KEFLEX) 500 MG capsule Take 1 capsule (500 mg total) by mouth 3 (three) times daily. 07/31/19   Lezlie Ritchey, Roselyn Bering, PA-C  fluconazole (DIFLUCAN) 150 MG tablet Take one now and one in a week 07/31/19   Sherrie Mustache, Roselyn Bering, PA-C  HYDROcodone-acetaminophen (NORCO/VICODIN) 5-325 MG tablet Take 1 tablet by mouth every 6 (six) hours as needed for moderate pain. For acute pain not controlled by regular medication 07/31/19   Sherrie Mustache, Roselyn Bering, PA-C  naproxen (NAPROSYN) 500 MG tablet Take 500 mg by mouth 2 (two) times daily with a meal.     [provider]  omeprazole (PRILOSEC) 40 MG capsule Take 40 mg by mouth as needed (for reflux).     [provider]    Allergies Other  Family History  Problem Relation Age of Onset  . Cancer Mother   . Diabetes Father   .  Alcohol abuse Father   . Multiple sclerosis Daughter     Social History Social History   Tobacco Use  . Smoking status: Current Every Day Smoker    Packs/day: 1.00    Years: 15.00    Pack years: 15.00    Types: Cigarettes  . Smokeless tobacco: Never Used  Substance Use Topics  . Alcohol use: No  . Drug use: No    Review of Systems  Constitutional: No fever/chills Eyes: No visual changes. ENT: No sore throat. Respiratory: Denies cough Genitourinary: Negative for dysuria. Musculoskeletal: Negative for back pain.  Positive for left thumb pain Skin: Negative for rash. Psychiatric:  no mood changes,     ____________________________________________   PHYSICAL EXAM:  VITAL SIGNS: ED Triage Vitals  Enc Vitals Group     BP 07/31/19 1132 (!) 145/93     Pulse Rate 07/31/19 1132 (!) 101     Resp 07/31/19 1132 18     Temp 07/31/19 1130 98.6 F (37 C)     Temp Source 07/31/19 1130 Oral     SpO2 07/31/19 1132 96 %     Weight 07/31/19 1131 249 lb 1.9 oz (113 kg)     Height 07/31/19 1131 5\' 6"  (1.676 m)     Head Circumference --      Peak Flow --      Pain Score 07/31/19 1131 8     Pain Loc --      Pain Edu? --      Excl. in GC? --     Constitutional: Alert and oriented. Well appearing and in no acute distress. Eyes: Conjunctivae are normal.  Head: Atraumatic. Nose: No congestion/rhinnorhea. Mouth/Throat: Mucous membranes are moist.   Neck:  supple no lymphadenopathy noted Cardiovascular: Normal rate, regular rhythm.  Respiratory: Normal respiratory effort.  No retractions,  GU: deferred Musculoskeletal: FROM all extremities, warm and well perfused Neurologic:  Normal speech and language.  Skin:  Skin is warm, dry and intact. No rash noted. Psychiatric: Mood and affect are normal. Speech and behavior are normal.  ____________________________________________   LABS (all labs ordered are listed, but only abnormal results are displayed)  Labs Reviewed  CBC WITH DIFFERENTIAL/PLATELET - Abnormal; Notable for the following components:      Result Value   WBC 11.2 (*)    RBC 5.45 (*)    Hemoglobin 15.2 (*)    MCV 77.6 (*)    Platelets 432 (*)    Lymphs Abs 4.4 (*)    All other components within normal limits  COMPREHENSIVE METABOLIC PANEL - Abnormal; Notable for the following components:   Sodium 134 (*)    Glucose, Bld 295 (*)    Albumin 3.4 (*)    All other components within normal limits   ____________________________________________   ____________________________________________  RADIOLOGY  X-ray of the left thumb shows a questionable  infection at the t tuft  ____________________________________________   PROCEDURES  Procedure(s) performed:   Drain paronychia  Date/Time: 07/31/2019 3:09 PM Performed by: 08/02/2019, PA-C Authorized by: Faythe Ghee, PA-C  Preparation: Patient was prepped and draped in the usual sterile fashion. Local anesthesia used: yes Anesthesia: digital block  Anesthesia: Local anesthesia used: yes Local Anesthetic: lidocaine 1% without epinephrine Patient tolerance: patient tolerated the procedure well with no immediate complications Comments: Small amount of pus expelled from the area at the nailbed, incision to the lateral aspect did not produce any pus       ____________________________________________   INITIAL  IMPRESSION / ASSESSMENT AND PLAN / ED COURSE  Pertinent labs & imaging results that were available during my care of the patient were reviewed by me and considered in my medical decision making (see chart for details).   Patient is a 39 year old diabetic female presents emergency department with infection to the left thumb.  Has been taking Septra for 5 to 6 days.  See HPI.  Physical exam shows a swollen tongue, positive paronychia, tuft of the fingers also swollen and tender  X-ray of the left thumb shows questionable irregularity.  DDx: Paronychia, osteomyelitis, herpetic whitlow  CBC has elevated WBC of 11.2, glucose is elevated at 295  I did discuss findings with Dr.Poggi He recommends incision and drainage of the paronychia, Ancef IV, discharged with Keflex and wound recheck tomorrow.  See procedure note for drainage of the paronychia  Patient tolerated procedure well.  States area still feels tight and swollen.  Explained her she needs to continue to soak the finger every night and return tomorrow for a wound check.  If worsening or not improving with the IND along with IV antibiotics meet criteria for admission.  She states she understands.  She was  discharged stable condition.     As part of my medical decision making, I reviewed the following data within the electronic MEDICAL RECORD NUMBER Nursing notes reviewed and incorporated, Labs reviewed , Old chart reviewed, Radiograph reviewed , A consult was requested and obtained from this/these consultant(s) Orthopedics, Notes from prior ED visits and [place] Controlled Substance Database  ____________________________________________   FINAL CLINICAL IMPRESSION(S) / ED DIAGNOSES  Final diagnoses:  Acute paronychia of left thumb      NEW MEDICATIONS STARTED DURING THIS VISIT:  Discharge Medication List as of 07/31/2019  2:12 PM    START taking these medications   Details  cephALEXin (KEFLEX) 500 MG capsule Take 1 capsule (500 mg total) by mouth 3 (three) times daily., Starting Sat 07/31/2019, Normal    fluconazole (DIFLUCAN) 150 MG tablet Take one now and one in a week, Normal    HYDROcodone-acetaminophen (NORCO/VICODIN) 5-325 MG tablet Take 1 tablet by mouth every 6 (six) hours as needed for moderate pain. For acute pain not controlled by regular medication, Starting Sat 07/31/2019, Normal         Note:  This document was prepared using Dragon voice recognition software and may include unintentional dictation errors.    Faythe Ghee, PA-C 07/31/19 1513    Concha Se, MD 08/01/19 0800

## 2019-07-31 NOTE — ED Notes (Signed)
Patient called for vital signs check with no answer 

## 2019-07-31 NOTE — ED Notes (Signed)
Patient called for vital sign check with no answer. 

## 2019-07-31 NOTE — Discharge Instructions (Addendum)
Return to the emergency department tomorrow for recheck.  Take medication as prescribed.  Continue to soak your finger in warm water with Epson salt.

## 2019-07-31 NOTE — ED Provider Notes (Signed)
[facility] Emergency Department Provider Note  ____________________________________________   First MD Initiated Contact with Patient 07/31/19 2011     (approximate)  I have reviewed the triage vital signs and the nursing notes.   HISTORY  Chief Complaint Hand Pain    HPI Kendra Perkins is a 39 y.o. female  Here with ongoing finger pain. Pt was just seen earlier this afternoon and had a paronychia drained and was started on ABX. She had been on Bactrim w/o relief. No fevers, no signs of sepsis. She returned home and her pain returned after her block wore off. She felt it was unbearable so she came back to the ED. No fever, chills, no new drainage.        Past Medical History:  Diagnosis Date  . Arthritis    lower back  . Asthma    rarely uses inhaler  . Back pain 2000  . Depression    no meds  . Diabetes mellitus without complication (HCC)    borderline - no meds  . GERD (gastroesophageal reflux disease)    occasional - diet controlled    Patient Active Problem List   Diagnosis Date Noted  . Type 2 diabetes mellitus without complication (HCC) 11/08/2015  . Rh negative state in antepartum period 11/08/2015  . Missed abortion 11/01/2015  . Supervision of high-risk pregnancy 10/02/2015  . History of pre-eclampsia in prior pregnancy, currently pregnant in first trimester 10/02/2015  . History of gestational diabetes mellitus (GDM) 10/02/2015  . History of cesarean delivery 10/02/2015  . BMI 40.0-44.9, adult (HCC) 10/02/2015  . Obesity in pregnancy 10/02/2015  . Tobacco abuse 10/02/2015  . Chronic low back pain 10/02/2015    Past Surgical History:  Procedure Laterality Date  . APPENDECTOMY     ?. pt thinks she had it removed (laparoscopically)  . CESAREAN SECTION  2002, 2003   x 2  . DILATION AND EVACUATION N/A 11/08/2015   Procedure: DILATATION AND EVACUATION;  Surgeon: Tereso Newcomer, MD;  Location: [facility];  Service:  Gynecology;  Laterality: N/A;  . WISDOM TOOTH EXTRACTION      Prior to Admission medications   Medication Sig Start Date End Date Taking? Authorizing Provider  albuterol (PROVENTIL HFA;VENTOLIN HFA) 108 (90 Base) MCG/ACT inhaler Inhale 2 puffs into the lungs every 6 (six) hours as needed for wheezing or shortness of breath.     [provider]  cephALEXin (KEFLEX) 500 MG capsule Take 1 capsule (500 mg total) by mouth 3 (three) times daily. 07/31/19   Fisher, Roselyn Bering, PA-C  doxycycline (VIBRAMYCIN) 100 MG capsule Take 1 capsule (100 mg total) by mouth 2 (two) times daily for 7 days. 07/31/19 08/07/19  Shaune Pollack, MD  fluconazole (DIFLUCAN) 150 MG tablet Take one now and one in a week 07/31/19   Sherrie Mustache, Roselyn Bering, PA-C  HYDROcodone-acetaminophen (NORCO/VICODIN) 5-325 MG tablet Take 1 tablet by mouth every 6 (six) hours as needed for moderate pain. For acute pain not controlled by regular medication 07/31/19   Sherrie Mustache, Roselyn Bering, PA-C  naproxen (NAPROSYN) 500 MG tablet Take 500 mg by mouth 2 (two) times daily with a meal.     [provider]  omeprazole (PRILOSEC) 40 MG capsule Take 40 mg by mouth as needed (for reflux).     [provider]    Allergies Other  Family History  Problem Relation Age of Onset  . Cancer Mother   . Diabetes Father   . Alcohol  abuse Father   . Multiple sclerosis Daughter     Social History Social History   Tobacco Use  . Smoking status: Current Every Day Smoker    Packs/day: 1.00    Years: 15.00    Pack years: 15.00    Types: Cigarettes  . Smokeless tobacco: Never Used  Substance Use Topics  . Alcohol use: No  . Drug use: No    Review of Systems  Review of Systems  Constitutional: Negative for chills and fever.  HENT: Negative for sore throat.   Respiratory: Negative for shortness of breath.   Cardiovascular: Negative for chest pain.  Gastrointestinal: Negative for abdominal pain.  Genitourinary: Negative for flank pain.    Musculoskeletal: Negative for neck pain.  Skin: Positive for rash and wound.  Allergic/Immunologic: Negative for immunocompromised state.  Neurological: Negative for weakness and numbness.  Hematological: Does not bruise/bleed easily.     ____________________________________________  PHYSICAL EXAM:      VITAL SIGNS: ED Triage Vitals [07/31/19 1745]  Enc Vitals Group     BP (!) 155/85     Pulse Rate 90     Resp 18     Temp 98.2 F (36.8 C)     Temp Source Oral     SpO2 95 %     Weight 249 lb 1.9 oz (113 kg)     Height 5\' 6"  (1.676 m)     Head Circumference      Peak Flow      Pain Score 9     Pain Loc      Pain Edu?      Excl. in GC?      Physical Exam Vitals and nursing note reviewed.  Constitutional:      General: She is not in acute distress.    Appearance: She is well-developed.  HENT:     Head: Normocephalic and atraumatic.  Eyes:     Conjunctiva/sclera: Conjunctivae normal.  Cardiovascular:     Rate and Rhythm: Normal rate and regular rhythm.     Heart sounds: Normal heart sounds.  Pulmonary:     Effort: Pulmonary effort is normal. No respiratory distress.     Breath sounds: No wheezing.  Abdominal:     General: There is no distension.  Musculoskeletal:     Cervical back: Neck supple.  Skin:    General: Skin is warm.     Capillary Refill: Capillary refill takes less than 2 seconds.     Findings: No rash.  Neurological:     Mental Status: She is alert and oriented to person, place, and time.     Motor: No abnormal muscle tone.     Finger: Left thumb with minimal erythema along distal aspect of distal phalanx, with drained paronychia. Moderate edema/induration noted to tuft of finger but no extension tot he proximal flexor or extensor tendons.  ____________________________________________   LABS (all labs ordered are listed, but only abnormal results are displayed)  Labs Reviewed - No data to  display  ____________________________________________  EKG: None ________________________________________  RADIOLOGY All imaging, including plain films, CT scans, and ultrasounds, independently reviewed by me, and interpretations confirmed via formal radiology reads.  ED MD interpretation:   None this visit  Official radiology report(s): DG Finger Thumb Left  Result Date: 07/31/2019 CLINICAL DATA:  Pain and swelling in the left thumb.  Infection. EXAM: LEFT THUMB 2+V COMPARISON:  None. FINDINGS: Negative for fracture or dislocation involving the left thumb. Questionable mild irregularity involving the  tuft. Otherwise, no evidence for bone destruction. No evidence for periosteal reaction. IMPRESSION: 1. Negative for fracture or dislocation. 2. Question mild irregularity involving the thumb tuft. Subtle cortical irregularity in this area cannot be excluded. Please correlate this radiographic finding with the area of concern on physical examination. Electronically Signed   By: Richarda Overlie M.D.   On: 07/31/2019 12:43    ____________________________________________  PROCEDURES   Procedure(s) performed (including Critical Care):  Marland KitchenMarland KitchenIncision and Drainage  Date/Time: 08/01/2019 1:30 AM Performed by: Shaune Pollack, MD Authorized by: Shaune Pollack, MD   Consent:    Consent obtained:  Verbal   Consent given by:  Patient   Risks discussed:  Bleeding, damage to other organs, incomplete drainage, infection and pain   Alternatives discussed:  Alternative treatment and delayed treatment Location:    Type:  Abscess   Location:  Upper extremity   Upper extremity location:  Finger   Finger location:  L thumb Pre-procedure details:    Skin preparation:  Betadine Anesthesia (see MAR for exact dosages):    Anesthesia method:  Nerve block   Block needle gauge:  27 G   Block anesthetic:  Bupivacaine 0.5% w/o epi   Block technique:  Digital   Block injection procedure:  Anatomic landmarks  identified, introduced needle, incremental injection, negative aspiration for blood and anatomic landmarks palpated   Block outcome:  Anesthesia achieved Procedure type:    Complexity:  Simple Procedure details:    Incision types:  Single straight   Incision depth:  Dermal   Scalpel blade:  11   Wound management:  Probed and deloculated and irrigated with saline   Drainage:  Purulent and bloody   Drainage amount:  Scant   Packing materials:  None Post-procedure details:    Patient tolerance of procedure:  Tolerated well, no immediate complications    ____________________________________________  INITIAL IMPRESSION / MDM / ASSESSMENT AND PLAN / ED COURSE  As part of my medical decision making, I reviewed the following data within the electronic MEDICAL RECORD NUMBER Nursing notes reviewed and incorporated, Old chart reviewed, Notes from prior ED visits, and [place] Controlled Substance Database       *SHALEAH NISSLEY was evaluated in Emergency Department on 07/31/2019 for the symptoms described in the history of present illness. She was evaluated in the context of the global COVID-19 pandemic, which necessitated consideration that the patient might be at risk for infection with the SARS-CoV-2 virus that causes COVID-19. Institutional protocols and algorithms that pertain to the evaluation of patients at risk for COVID-19 are in a state of rapid change based on information released by regulatory bodies including the CDC and federal and state organizations. These policies and algorithms were followed during the patient's care in the ED.  Some ED evaluations and interventions may be delayed as a result of limited staffing during the pandemic.*     Medical Decision Making:  39 yo F here with ongoing finger pain in setting of recent paronychia drainage. On exam, there is minimal erythema, no streaking, no tenderness along proximal finger - do not suspect significant cellulitis or worsening  infection. She does have a possible small area of early felon.  Given her ongoing pain, after informed consent digital block completed with drainage of felon. Volar stab incision made along midline. Tolerated well with no bleeding or complications. Small amount of purulence expressed. As mentioned, no signs of worsening infection or tenosynovitis.   Will add Doxy to her keflex regimen, d/c with  outpt follow-up that had been arranged.  ____________________________________________  FINAL CLINICAL IMPRESSION(S) / ED DIAGNOSES  Final diagnoses:  Felon of finger     MEDICATIONS GIVEN DURING THIS VISIT:  Medications  ketorolac (TORADOL) 30 MG/ML injection 15 mg (15 mg Intravenous Given 07/31/19 1828)  bupivacaine (MARCAINE) 0.5 % (with pres) injection 10 mL (10 mLs Infiltration Given by Other 07/31/19 2030)  cefTRIAXone (ROCEPHIN) 2 g in sodium chloride 0.9 % 100 mL IVPB (0 g Intravenous Stopped 07/31/19 2300)  doxycycline (VIBRA-TABS) tablet 100 mg (100 mg Oral Given 07/31/19 2210)     ED Discharge Orders         Ordered    doxycycline (VIBRAMYCIN) 100 MG capsule  2 times daily     Discontinue  Reprint     07/31/19 2254           Note:  This document was prepared using Dragon voice recognition software and may include unintentional dictation errors.   Shaune Pollack, MD 08/01/19 [phone]

## 2019-08-06 ENCOUNTER — Encounter: Payer: Self-pay | Admitting: Emergency Medicine

## 2019-08-06 ENCOUNTER — Other Ambulatory Visit: Payer: Self-pay

## 2019-08-06 DIAGNOSIS — M545 Low back pain: Secondary | ICD-10-CM | POA: Diagnosis present

## 2019-08-06 DIAGNOSIS — K219 Gastro-esophageal reflux disease without esophagitis: Secondary | ICD-10-CM | POA: Diagnosis present

## 2019-08-06 DIAGNOSIS — M868X4 Other osteomyelitis, hand: Secondary | ICD-10-CM | POA: Diagnosis present

## 2019-08-06 DIAGNOSIS — Z20822 Contact with and (suspected) exposure to covid-19: Secondary | ICD-10-CM | POA: Diagnosis present

## 2019-08-06 DIAGNOSIS — F17210 Nicotine dependence, cigarettes, uncomplicated: Secondary | ICD-10-CM | POA: Diagnosis present

## 2019-08-06 DIAGNOSIS — B373 Candidiasis of vulva and vagina: Secondary | ICD-10-CM | POA: Diagnosis present

## 2019-08-06 DIAGNOSIS — E1169 Type 2 diabetes mellitus with other specified complication: Secondary | ICD-10-CM | POA: Diagnosis present

## 2019-08-06 DIAGNOSIS — Z794 Long term (current) use of insulin: Secondary | ICD-10-CM

## 2019-08-06 DIAGNOSIS — Z820 Family history of epilepsy and other diseases of the nervous system: Secondary | ICD-10-CM

## 2019-08-06 DIAGNOSIS — Z833 Family history of diabetes mellitus: Secondary | ICD-10-CM

## 2019-08-06 DIAGNOSIS — E872 Acidosis: Secondary | ICD-10-CM | POA: Diagnosis present

## 2019-08-06 DIAGNOSIS — L03012 Cellulitis of left finger: Secondary | ICD-10-CM | POA: Diagnosis present

## 2019-08-06 DIAGNOSIS — G894 Chronic pain syndrome: Secondary | ICD-10-CM | POA: Diagnosis present

## 2019-08-06 DIAGNOSIS — Z79899 Other long term (current) drug therapy: Secondary | ICD-10-CM

## 2019-08-06 DIAGNOSIS — E1165 Type 2 diabetes mellitus with hyperglycemia: Secondary | ICD-10-CM | POA: Diagnosis present

## 2019-08-06 DIAGNOSIS — Z811 Family history of alcohol abuse and dependence: Secondary | ICD-10-CM

## 2019-08-06 DIAGNOSIS — Z23 Encounter for immunization: Secondary | ICD-10-CM

## 2019-08-06 DIAGNOSIS — A419 Sepsis, unspecified organism: Principal | ICD-10-CM | POA: Diagnosis present

## 2019-08-06 LAB — CBC WITH DIFFERENTIAL/PLATELET
Abs Immature Granulocytes: 0.11 10*3/uL — ABNORMAL HIGH (ref 0.00–0.07)
Basophils Absolute: 0.1 10*3/uL (ref 0.0–0.1)
Basophils Relative: 1 %
Eosinophils Absolute: 0.3 10*3/uL (ref 0.0–0.5)
Eosinophils Relative: 2 %
HCT: 43.5 % (ref 36.0–46.0)
Hemoglobin: 15 g/dL (ref 12.0–15.0)
Immature Granulocytes: 1 %
Lymphocytes Relative: 32 %
Lymphs Abs: 5.1 10*3/uL — ABNORMAL HIGH (ref 0.7–4.0)
MCH: 27.5 pg (ref 26.0–34.0)
MCHC: 34.5 g/dL (ref 30.0–36.0)
MCV: 79.7 fL — ABNORMAL LOW (ref 80.0–100.0)
Monocytes Absolute: 0.7 10*3/uL (ref 0.1–1.0)
Monocytes Relative: 4 %
Neutro Abs: 9.7 10*3/uL — ABNORMAL HIGH (ref 1.7–7.7)
Neutrophils Relative %: 60 %
Platelets: 502 10*3/uL — ABNORMAL HIGH (ref 150–400)
RBC: 5.46 MIL/uL — ABNORMAL HIGH (ref 3.87–5.11)
RDW: 12.9 % (ref 11.5–15.5)
WBC: 16 10*3/uL — ABNORMAL HIGH (ref 4.0–10.5)
nRBC: 0 % (ref 0.0–0.2)

## 2019-08-06 LAB — COMPREHENSIVE METABOLIC PANEL
ALT: 17 U/L (ref 0–44)
AST: 17 U/L (ref 15–41)
Albumin: 3.5 g/dL (ref 3.5–5.0)
Alkaline Phosphatase: 105 U/L (ref 38–126)
Anion gap: 12 (ref 5–15)
BUN: 11 mg/dL (ref 6–20)
CO2: 23 mmol/L (ref 22–32)
Calcium: 9.1 mg/dL (ref 8.9–10.3)
Chloride: 100 mmol/L (ref 98–111)
Creatinine, Ser: 0.63 mg/dL (ref 0.44–1.00)
GFR calc Af Amer: >60 mL/min (ref >60)
GFR calc non Af Amer: >60 mL/min (ref >60)
Glucose, Bld: 275 mg/dL — ABNORMAL HIGH (ref 70–99)
Potassium: 3.4 mmol/L — ABNORMAL LOW (ref 3.5–5.1)
Sodium: 135 mmol/L (ref 135–145)
Total Bilirubin: 0.6 mg/dL (ref 0.3–1.2)
Total Protein: 7.3 g/dL (ref 6.5–8.1)

## 2019-08-06 LAB — LACTIC ACID, PLASMA: Lactic Acid, Venous: 2.6 mmol/L (ref 0.5–1.9)

## 2019-08-06 NOTE — ED Triage Notes (Signed)
Pt was here X 2 for paronychia of left thumb. Reports was drained both visits but still continues to have pain and some swelling despite 3 abx. Denies fevers.

## 2019-08-07 ENCOUNTER — Emergency Department: Payer: Medicaid Other

## 2019-08-07 ENCOUNTER — Inpatient Hospital Stay: Payer: Medicaid Other

## 2019-08-07 ENCOUNTER — Inpatient Hospital Stay
Admission: EM | Admit: 2019-08-07 | Discharge: 2019-08-11 | DRG: 872 | Disposition: A | Discharge status: Home / Self Care | Payer: Medicaid Other | Attending: Internal Medicine | Admitting: Internal Medicine

## 2019-08-07 DIAGNOSIS — M545 Low back pain: Secondary | ICD-10-CM | POA: Diagnosis present

## 2019-08-07 DIAGNOSIS — Z811 Family history of alcohol abuse and dependence: Secondary | ICD-10-CM | POA: Diagnosis not present

## 2019-08-07 DIAGNOSIS — F17210 Nicotine dependence, cigarettes, uncomplicated: Secondary | ICD-10-CM | POA: Diagnosis present

## 2019-08-07 DIAGNOSIS — Z79899 Other long term (current) drug therapy: Secondary | ICD-10-CM | POA: Diagnosis not present

## 2019-08-07 DIAGNOSIS — E1165 Type 2 diabetes mellitus with hyperglycemia: Secondary | ICD-10-CM | POA: Diagnosis present

## 2019-08-07 DIAGNOSIS — A419 Sepsis, unspecified organism: Secondary | ICD-10-CM | POA: Diagnosis present

## 2019-08-07 DIAGNOSIS — Z794 Long term (current) use of insulin: Secondary | ICD-10-CM | POA: Diagnosis not present

## 2019-08-07 DIAGNOSIS — L03012 Cellulitis of left finger: Secondary | ICD-10-CM

## 2019-08-07 DIAGNOSIS — E872 Acidosis: Secondary | ICD-10-CM | POA: Diagnosis present

## 2019-08-07 DIAGNOSIS — G894 Chronic pain syndrome: Secondary | ICD-10-CM | POA: Diagnosis present

## 2019-08-07 DIAGNOSIS — M868X4 Other osteomyelitis, hand: Secondary | ICD-10-CM | POA: Diagnosis present

## 2019-08-07 DIAGNOSIS — Z833 Family history of diabetes mellitus: Secondary | ICD-10-CM | POA: Diagnosis not present

## 2019-08-07 DIAGNOSIS — Z820 Family history of epilepsy and other diseases of the nervous system: Secondary | ICD-10-CM | POA: Diagnosis not present

## 2019-08-07 DIAGNOSIS — B373 Candidiasis of vulva and vagina: Secondary | ICD-10-CM | POA: Diagnosis present

## 2019-08-07 DIAGNOSIS — E1169 Type 2 diabetes mellitus with other specified complication: Secondary | ICD-10-CM | POA: Diagnosis present

## 2019-08-07 DIAGNOSIS — L089 Local infection of the skin and subcutaneous tissue, unspecified: Secondary | ICD-10-CM

## 2019-08-07 DIAGNOSIS — Z20822 Contact with and (suspected) exposure to covid-19: Secondary | ICD-10-CM | POA: Diagnosis present

## 2019-08-07 DIAGNOSIS — K219 Gastro-esophageal reflux disease without esophagitis: Secondary | ICD-10-CM | POA: Diagnosis present

## 2019-08-07 DIAGNOSIS — Z23 Encounter for immunization: Secondary | ICD-10-CM | POA: Diagnosis not present

## 2019-08-07 LAB — HIV ANTIBODY (ROUTINE TESTING W REFLEX): HIV Screen 4th Generation wRfx: NONREACTIVE

## 2019-08-07 LAB — PROCALCITONIN
Procalcitonin: <0.1 ng/mL
Procalcitonin: <0.1 ng/mL

## 2019-08-07 LAB — GLUCOSE, CAPILLARY
Glucose-Capillary: 301 mg/dL — ABNORMAL HIGH (ref 70–99)
Glucose-Capillary: 311 mg/dL — ABNORMAL HIGH (ref 70–99)
Glucose-Capillary: 260 mg/dL — ABNORMAL HIGH (ref 70–99)
Glucose-Capillary: 281 mg/dL — ABNORMAL HIGH (ref 70–99)
Glucose-Capillary: 232 mg/dL — ABNORMAL HIGH (ref 70–99)

## 2019-08-07 LAB — CBC
HCT: 39.2 % (ref 36.0–46.0)
Hemoglobin: 13.5 g/dL (ref 12.0–15.0)
MCH: 27.5 pg (ref 26.0–34.0)
MCHC: 34.4 g/dL (ref 30.0–36.0)
MCV: 79.8 fL — ABNORMAL LOW (ref 80.0–100.0)
Platelets: 470 10*3/uL — ABNORMAL HIGH (ref 150–400)
RBC: 4.91 MIL/uL (ref 3.87–5.11)
RDW: 13 % (ref 11.5–15.5)
WBC: 16.8 10*3/uL — ABNORMAL HIGH (ref 4.0–10.5)
nRBC: 0 % (ref 0.0–0.2)

## 2019-08-07 LAB — URINALYSIS, COMPLETE (UACMP) WITH MICROSCOPIC
Bacteria, UA: NONE SEEN
Bilirubin Urine: NEGATIVE
Glucose, UA: >=500 mg/dL — AB
Hgb urine dipstick: NEGATIVE
Ketones, ur: NEGATIVE mg/dL
Leukocytes,Ua: NEGATIVE
Nitrite: NEGATIVE
Protein, ur: 100 mg/dL — AB
Specific Gravity, Urine: 1.025 (ref 1.005–1.030)
pH: 6 (ref 5.0–8.0)

## 2019-08-07 LAB — SARS CORONAVIRUS 2 BY RT PCR (HOSPITAL ORDER, PERFORMED IN ~~LOC~~ HOSPITAL LAB): SARS Coronavirus 2: NEGATIVE

## 2019-08-07 LAB — CREATININE, SERUM
Creatinine, Ser: 0.66 mg/dL (ref 0.44–1.00)
GFR calc Af Amer: >60 mL/min (ref >60)
GFR calc non Af Amer: >60 mL/min (ref >60)

## 2019-08-07 LAB — HEMOGLOBIN A1C
Hgb A1c MFr Bld: 10.6 % — ABNORMAL HIGH (ref 4.8–5.6)
Mean Plasma Glucose: 257.52 mg/dL

## 2019-08-07 LAB — LACTIC ACID, PLASMA
Lactic Acid, Venous: 2.7 mmol/L (ref 0.5–1.9)
Lactic Acid, Venous: 2.5 mmol/L (ref 0.5–1.9)

## 2019-08-07 LAB — CORTISOL-AM, BLOOD: Cortisol - AM: 2.7 ug/dL — ABNORMAL LOW (ref 6.7–22.6)

## 2019-08-07 LAB — PROTIME-INR
INR: 1 (ref 0.8–1.2)
Prothrombin Time: 12.9 seconds (ref 11.4–15.2)

## 2019-08-07 MED ORDER — HYDROCODONE-ACETAMINOPHEN 5-325 MG PO TABS
1.0000 | ORAL_TABLET | ORAL | Status: DC | PRN
  Administered 2019-08-07 – 2019-08-09 (×12): 2 via ORAL
  Filled 2019-08-07 (×12): qty 2

## 2019-08-07 MED ORDER — LACTATED RINGERS IV BOLUS (SEPSIS)
2000.0000 mL | Freq: Once | INTRAVENOUS | Status: AC
  Administered 2019-08-07: 2000 mL via INTRAVENOUS

## 2019-08-07 MED ORDER — INSULIN ASPART 100 UNIT/ML ~~LOC~~ SOLN: 0.0000 [IU] | Freq: Every day | SUBCUTANEOUS | Status: DC

## 2019-08-07 MED ORDER — VANCOMYCIN HCL IN DEXTROSE 1-5 GM/200ML-% IV SOLN
1000.0000 mg | Freq: Once | INTRAVENOUS | Status: AC
  Administered 2019-08-07: 1000 mg via INTRAVENOUS
  Filled 2019-08-07: qty 200

## 2019-08-07 MED ORDER — ENOXAPARIN SODIUM 40 MG/0.4ML ~~LOC~~ SOLN
40.0000 mg | SUBCUTANEOUS | Status: DC
  Administered 2019-08-07 – 2019-08-11 (×5): 40 mg via SUBCUTANEOUS
  Filled 2019-08-07 (×5): qty 0.4

## 2019-08-07 MED ORDER — GADOBUTROL 1 MMOL/ML IV SOLN
9.0000 mL | Freq: Once | INTRAVENOUS | Status: AC | PRN
  Administered 2019-08-07: 10 mL via INTRAVENOUS
  Filled 2019-08-07: qty 10

## 2019-08-07 MED ORDER — PIPERACILLIN-TAZOBACTAM 3.375 G IVPB
3.3750 g | Freq: Three times a day (TID) | INTRAVENOUS | Status: DC
  Administered 2019-08-07 – 2019-08-11 (×12): 3.375 g via INTRAVENOUS
  Filled 2019-08-07 (×12): qty 50

## 2019-08-07 MED ORDER — OXYCODONE HCL 5 MG PO TABS
5.0000 mg | ORAL_TABLET | Freq: Once | ORAL | Status: AC
  Administered 2019-08-07: 5 mg via ORAL
  Filled 2019-08-07: qty 1

## 2019-08-07 MED ORDER — PANTOPRAZOLE SODIUM 40 MG PO TBEC
40.0000 mg | DELAYED_RELEASE_TABLET | Freq: Every day | ORAL | Status: DC
  Administered 2019-08-08 – 2019-08-11 (×4): 40 mg via ORAL
  Filled 2019-08-07 (×4): qty 1

## 2019-08-07 MED ORDER — ONDANSETRON HCL 4 MG/2ML IJ SOLN: 4.0000 mg | Freq: Four times a day (QID) | INTRAMUSCULAR | Status: DC | PRN

## 2019-08-07 MED ORDER — KETOROLAC TROMETHAMINE 30 MG/ML IJ SOLN
30.0000 mg | Freq: Four times a day (QID) | INTRAMUSCULAR | Status: DC | PRN
  Administered 2019-08-07: 30 mg via INTRAVENOUS
  Filled 2019-08-07 (×2): qty 1

## 2019-08-07 MED ORDER — ACETAMINOPHEN 325 MG PO TABS
650.0000 mg | ORAL_TABLET | Freq: Four times a day (QID) | ORAL | Status: DC | PRN
  Administered 2019-08-09 – 2019-08-10 (×3): 650 mg via ORAL
  Filled 2019-08-07 (×3): qty 2

## 2019-08-07 MED ORDER — INSULIN ASPART 100 UNIT/ML ~~LOC~~ SOLN
0.0000 [IU] | Freq: Three times a day (TID) | SUBCUTANEOUS | Status: DC
  Administered 2019-08-07: 8 [IU] via SUBCUTANEOUS
  Administered 2019-08-07: 11 [IU] via SUBCUTANEOUS
  Administered 2019-08-07: 8 [IU] via SUBCUTANEOUS
  Administered 2019-08-08: 3 [IU] via SUBCUTANEOUS
  Administered 2019-08-08: 8 [IU] via SUBCUTANEOUS
  Administered 2019-08-08: 5 [IU] via SUBCUTANEOUS
  Administered 2019-08-09: 3 [IU] via SUBCUTANEOUS
  Administered 2019-08-09 (×2): 5 [IU] via SUBCUTANEOUS
  Administered 2019-08-10 – 2019-08-11 (×4): 3 [IU] via SUBCUTANEOUS
  Filled 2019-08-07 (×13): qty 1

## 2019-08-07 MED ORDER — INSULIN DETEMIR 100 UNIT/ML ~~LOC~~ SOLN
35.0000 [IU] | Freq: Every day | SUBCUTANEOUS | Status: DC
  Administered 2019-08-07 – 2019-08-08 (×2): 35 [IU] via SUBCUTANEOUS
  Filled 2019-08-07 (×3): qty 0.35

## 2019-08-07 MED ORDER — ONDANSETRON HCL 4 MG/2ML IJ SOLN
4.0000 mg | INTRAMUSCULAR | Status: AC
  Administered 2019-08-07: 4 mg via INTRAVENOUS
  Filled 2019-08-07: qty 2

## 2019-08-07 MED ORDER — TETANUS-DIPHTH-ACELL PERTUSSIS 5-2.5-18.5 LF-MCG/0.5 IM SUSP
0.5000 mL | Freq: Once | INTRAMUSCULAR | Status: AC
  Administered 2019-08-07: 0.5 mL via INTRAMUSCULAR
  Filled 2019-08-07: qty 0.5

## 2019-08-07 MED ORDER — MORPHINE SULFATE (PF) 4 MG/ML IV SOLN
4.0000 mg | Freq: Once | INTRAVENOUS | Status: AC
  Administered 2019-08-07: 4 mg via INTRAVENOUS
  Filled 2019-08-07: qty 1

## 2019-08-07 MED ORDER — SODIUM CHLORIDE 0.9 % IV SOLN: INTRAVENOUS | Status: DC

## 2019-08-07 MED ORDER — ACETAMINOPHEN 650 MG RE SUPP: 650.0000 mg | Freq: Four times a day (QID) | RECTAL | Status: DC | PRN

## 2019-08-07 MED ORDER — PIPERACILLIN-TAZOBACTAM 3.375 G IVPB 30 MIN
3.3750 g | Freq: Once | INTRAVENOUS | Status: AC
  Administered 2019-08-07: 3.375 g via INTRAVENOUS
  Filled 2019-08-07: qty 50

## 2019-08-07 MED ORDER — ONDANSETRON HCL 4 MG PO TABS: 4.0000 mg | ORAL_TABLET | Freq: Four times a day (QID) | ORAL | Status: DC | PRN

## 2019-08-07 MED ORDER — VANCOMYCIN HCL IN DEXTROSE 1-5 GM/200ML-% IV SOLN
1000.0000 mg | Freq: Two times a day (BID) | INTRAVENOUS | Status: DC
  Administered 2019-08-07 – 2019-08-10 (×7): 1000 mg via INTRAVENOUS
  Filled 2019-08-07 (×8): qty 200

## 2019-08-07 NOTE — Progress Notes (Signed)
Pharmacy Antibiotic Note  Kendra Perkins is a 39 y.o. female admitted on 08/07/2019 with cellulitis.  Pharmacy has been consulted for Vancomycin and Zosyn dosing.  Plan: Zosyn 3.375g IV q8h (4 hour infusion).   Vancomycin 1000 mg IV Q 12 hrs. Goal AUC 400-550. Expected AUC: 484.2, Css min 13.1 SCr used: 0.8 Pt did not receive a full loading dose, will start next dose early   Height: 5\' 6"  (167.6 cm) Weight: (!) 104.3 kg (230 lb) IBW/kg (Calculated) : 59.3  Temp (24hrs), Avg:98.5 F (36.9 C), Min:98.5 F (36.9 C), Max:98.5 F (36.9 C)  Recent Labs  Lab 07/31/19 1152 08/06/19 1815 08/06/19 2008  WBC 11.2* 16.0*  --   CREATININE 0.52 0.63  --   LATICACIDVEN  --   --  2.6*    Estimated Creatinine Clearance: 117.5 mL/min (by C-G formula based on SCr of 0.63 mg/dL).    Allergies  Allergen Reactions  . Other Itching and Other (See Comments)    Cat dander = Sneezing     Antimicrobials this admission:   >>    >>   Dose adjustments this admission:   Microbiology results:  BCx:   UCx:    Sputum:    MRSA PCR:   Thank you for allowing pharmacy to be a part of this patient's care.  08/08/19 A 08/07/2019 3:42 AM

## 2019-08-07 NOTE — Progress Notes (Signed)
PROGRESS NOTE    Kendra Perkins  QBV:[id number] DOB: 03-18-1980 DOA: 08/07/2019 PCP: [facility]    Assessment & Plan:   Principal Problem:   Sepsis (HCC) Active Problems:   Infection of left hand   Hyperglycemia due to type 2 diabetes mellitus (HCC)    Kendra Perkins is a 39 y.o. female with medical history significant for diabetes, with infection of the distal left thumb for which she has had repeated visits to the emergency room with drainage of paronychia followed by drainage for possible felon, on outpatient antibiotic treatment who returns to the emergency room because of continued pain at the site of prior drainage, now extending to the palmar surface of the hand.  She denies fever or chills.    Sepsis (HCC) Osteomyelitis of left thumb -tachycardia, leukocytosis, elevated lactic acid.  Patient presents with recurrent pain of the left thumb not responding to oral antibiotics and attempted drainage from the emergency room --MRI showed early osteo -Start IV Zosyn and vancomycin PLAN: --continue empiric IV vanc/zosyn to prevent spread of infection -ortho consult   Lactic acidosis --presumed due to infection, however, remained persistently between 2-3 despite IVF --continue MIVF at decreased 100 ml/hr    Hyperglycemia due to type 2 diabetes mellitus (HCC) -start home Levemir 35u daily --ACHS and SSI TID  GERD --continue home PPI   DVT prophylaxis: Lovenox SQ Code Status: Full code  Family Communication: family updated at bedside today Status is: inpatient Dispo:   The patient is from: home Anticipated d/c is to: home Anticipated d/c date is: unclear Patient currently is not medically stable to d/c due to: osteo of thumb, needs surgical eval.   Subjective and Interval History:  Pt reported pain in her left thumb that extended to the rest of her left hand.  No fever.  ROS otherwise neg.   Objective: Vitals:   08/07/19 0816  08/07/19 0847 08/07/19 1218 08/07/19 1620  BP: (!) 139/94 108/82 (!) 117/91 127/81  Pulse: 93 91 92 101  Resp: 18 17 16 17   Temp: 97.7 F (36.5 C) 97.8 F (36.6 C) 98 F (36.7 C) 98 F (36.7 C)  TempSrc: Oral Oral Oral Oral  SpO2: 98% 99% 98% 98%  Weight:  (!) 108.6 kg    Height:  5\' 6"  (1.676 m)     No intake or output data in the 24 hours ending 08/07/19 2023 Filed Weights   08/06/19 1808 08/07/19 0847  Weight: (!) 104.3 kg (!) 108.6 kg    Examination:   Constitutional: NAD, AAOx3 HEENT: conjunctivae and lids normal, EOMI CV: RRR no M,R,G. Distal pulses +2.  No cyanosis.   RESP: CTA B/L, normal respiratory effort  GI: +BS, NTND Extremities: No effusions, edema, or tenderness in BLE MSK: left thumb mildly swollen, blue area under the nailbed SKIN: warm, dry Neuro: II - XII grossly intact.  Sensation intact Psych: Normal mood and affect.  Appropriate judgement and reason   Data Reviewed: I have personally reviewed following labs and imaging studies  CBC: Recent Labs  Lab 08/06/19 1815 08/07/19 0343  WBC 16.0* 16.8*  NEUTROABS 9.7*  --   HGB 15.0 13.5  HCT 43.5 39.2  MCV 79.7* 79.8*  PLT 502* 470*   Basic Metabolic Panel: Recent Labs  Lab 08/06/19 1815 08/07/19 0343  NA 135  --   K 3.4*  --   CL 100  --   CO2 23  --   GLUCOSE 275*  --  BUN 11  --   CREATININE 0.63 0.66  CALCIUM 9.1  --    GFR: Estimated Creatinine Clearance: 120.1 mL/min (by C-G formula based on SCr of 0.66 mg/dL). Liver Function Tests: Recent Labs  Lab 08/06/19 1815  AST 17  ALT 17  ALKPHOS 105  BILITOT 0.6  PROT 7.3  ALBUMIN 3.5   No results for input(s): LIPASE, AMYLASE in the last 168 hours. No results for input(s): AMMONIA in the last 168 hours. Coagulation Profile: Recent Labs  Lab 08/07/19 0343  INR 1.0   Cardiac Enzymes: No results for input(s): CKTOTAL, CKMB, CKMBINDEX, TROPONINI in the last 168 hours. BNP (last 3 results) No results for input(s): PROBNP  in the last 8760 hours. HbA1C: Recent Labs    08/07/19 0343  HGBA1C 10.6*   CBG: Recent Labs  Lab 08/07/19 0814 08/07/19 1152 08/07/19 1432 08/07/19 1815  GLUCAP 301* 311* 260* 281*   Lipid Profile: No results for input(s): CHOL, HDL, LDLCALC, TRIG, CHOLHDL, LDLDIRECT in the last 72 hours. Thyroid Function Tests: No results for input(s): TSH, T4TOTAL, FREET4, T3FREE, THYROIDAB in the last 72 hours. Anemia Panel: No results for input(s): VITAMINB12, FOLATE, FERRITIN, TIBC, IRON, RETICCTPCT in the last 72 hours. Sepsis Labs: Recent Labs  Lab 08/06/19 2008 08/07/19 0233 08/07/19 0343 08/07/19 1112  PROCALCITON  --  <0.10 <0.10  --   LATICACIDVEN 2.6*  --  2.7* 2.5*    Recent Results (from the past 240 hour(s))  Blood Culture (routine x 2)     Status: None (Preliminary result)   Collection Time: 08/07/19  2:34 AM   Specimen: BLOOD  Result Value Ref Range Status   Specimen Description BLOOD BLOOD RIGHT HAND  Final   Special Requests   Final    BOTTLES DRAWN AEROBIC AND ANAEROBIC Blood Culture adequate volume   Culture   Final    NO GROWTH < 12 HOURS Performed at [facility], [address] [place], [place] [address]    Report Status PENDING  Incomplete  Blood Culture (routine x 2)     Status: None (Preliminary result)   Collection Time: 08/07/19  2:34 AM   Specimen: BLOOD  Result Value Ref Range Status   Specimen Description BLOOD RIGHT ANTECUBITAL  Final   Special Requests   Final    BOTTLES DRAWN AEROBIC AND ANAEROBIC Blood Culture adequate volume   Culture   Final    NO GROWTH < 12 HOURS Performed at [facility], [address] [place], [place] [address]    Report Status PENDING  Incomplete  SARS Coronavirus 2 by RT PCR (hospital order, performed in [facility] hospital lab) Nasopharyngeal Nasopharyngeal Swab     Status: None   Collection Time: 08/07/19  3:06 AM   Specimen: Nasopharyngeal Swab  Result Value Ref Range Status   SARS  Coronavirus 2 NEGATIVE NEGATIVE Final    Comment: (NOTE) SARS-CoV-2 target nucleic acids are NOT DETECTED.  The SARS-CoV-2 RNA is generally detectable in upper and lower respiratory specimens during the acute phase of infection. The lowest concentration of SARS-CoV-2 viral copies this assay can detect is 250 copies / mL. A negative result does not preclude SARS-CoV-2 infection and should not be used as the sole basis for treatment or other patient management decisions.  A negative result may occur with improper specimen collection / handling, submission of specimen other than nasopharyngeal swab, presence of viral mutation(s) within the areas targeted by this assay, and inadequate number of viral copies (<  250 copies / mL). A negative result must be combined with clinical observations, patient history, and epidemiological information.  Fact Sheet for Patients:   BoilerBrush.com.cy  Fact Sheet for Healthcare Providers: https://pope.com/  This test is not yet approved or  cleared by the [place] FDA and has been authorized for detection and/or diagnosis of SARS-CoV-2 by FDA under an Emergency Use Authorization (EUA).  This EUA will remain in effect (meaning this test can be used) for the duration of the COVID-19 declaration under Section 564(b)(1) of the Act, 21 U.S.C. section 360bbb-3(b)(1), unless the authorization is terminated or revoked sooner.  Performed at [facility], [address] [place], [place] [address]       Radiology Studies: MR HAND LEFT W WO CONTRAST  Result Date: 08/07/2019 CLINICAL DATA:  Progressive left thumb for 2-3 weeks. Previous incision and drainage approximately 1 week ago. On antibiotics. Soft tissue infection suspected. EXAM: MRI OF THE LEFT HAND WITHOUT AND WITH CONTRAST TECHNIQUE: Multiplanar, multisequence MR imaging of the left thumb was performed before and after the administration  of intravenous contrast. CONTRAST:  10mL GADAVIST GADOBUTROL 1 MMOL/ML IV SOLN COMPARISON:  Radiograph 07/31/2019 FINDINGS: Bones/Joint/Cartilage Examination includes most of the left hand, concentrating on the thumb. There is intense marrow T2 hyperintensity and moderate enhancement within the distal phalanx of the thumb. There is equivocal decreased T1 marrow signal. No cortical destruction is identified. The proximal phalanx and additional digits appear normal. There are no suspicious findings within the metacarpals or carpal bones. There is a small intraosseous ganglion within the neck of the 1st metacarpal. No significant joint effusions or suspicious synovial enhancement. Ligaments The collateral ligaments of the metacarpal phalangeal joints appear intact. Muscles and Tendons Mild ECU tendinosis. The additional wrist and hand tendons appear intact without tenosynovitis or abnormal enhancement. No focal muscular abnormality or abnormal enhancement. Soft tissues Possible small focus of skin ulceration along the palmar aspect of the distal thumb. There is underlying diffuse T2 hyperintensity and enhancement throughout the soft tissues of the distal thumb, including this subungual region. No focal fluid collection or foreign body identified. No other significant soft tissue findings. IMPRESSION: 1. Diffuse T2 hyperintensity and enhancement throughout the soft tissues of the distal thumb, including this subungual region, consistent with soft tissue infection. No focal fluid collection or foreign body identified. 2. Prominent marrow T2 hyperintensity and enhancement within the distal phalanx of the thumb without cortical destruction, suspicious for early osteomyelitis. 3. No evidence of septic joint or other significant osseous findings. 4. Preliminary report of these findings was rendered by Dr. Chase Picket at [phone] hours. Electronically Signed   By: Carey Bullocks M.D.   On: 08/07/2019 08:55     Scheduled Meds:   enoxaparin (LOVENOX) injection  40 mg Subcutaneous Q24H   insulin aspart  0-15 Units Subcutaneous TID WC   insulin detemir  35 Units Subcutaneous Daily   pantoprazole  40 mg Oral Daily   Continuous Infusions:  sodium chloride 125 mL/hr at 08/07/19 1654   piperacillin-tazobactam (ZOSYN)  IV 3.375 g (08/07/19 1858)   vancomycin Stopped (08/07/19 0931)     LOS: 0 days     Darlin Priestly, MD [facility] If 7PM-7AM, please contact night-coverage 08/07/2019, 8:23 PM

## 2019-08-07 NOTE — H&P (Signed)
History and Physical    Kendra Perkins DVV:[id number] DOB: December 20, 1980 DOA: 08/07/2019  PCP: [facility]   Patient coming from: home  I have personally briefly reviewed patient's old medical records in [facility]  Chief Complaint: Pain left hand  HPI: Kendra Perkins is a 39 y.o. female with medical history significant for diabetes, with infection of the distal left thumb for which she has had repeated visits to the emergency room with drainage of paronychia followed by drainage for possible felon, on outpatient antibiotic treatment who returns to the emergency room because of continued pain at the site of prior drainage, now extending to the palmar surface of the hand.  She denies fever or chills.  Denies swelling. ED Course: On arrival in the ER she was afebrile, but tachycardic in the 90s to 1 teens with otherwise normal vitals.  Blood work significant for WBC of 16,000 and lactic acid 2.6.  MRI of the hand was ordered.  Hospitalist consulted for admission.  Review of Systems: As per HPI otherwise all other systems on review of systems negative.    Past Medical History:  Diagnosis Date  . Arthritis    lower back  . Asthma    rarely uses inhaler  . Back pain 2000  . Depression    no meds  . Diabetes mellitus without complication (HCC)    borderline - no meds  . GERD (gastroesophageal reflux disease)    occasional - diet controlled    Past Surgical History:  Procedure Laterality Date  . APPENDECTOMY     ?. pt thinks she had it removed (laparoscopically)  . CESAREAN SECTION  2002, 2003   x 2  . DILATION AND EVACUATION N/A 11/08/2015   Procedure: DILATATION AND EVACUATION;  Surgeon: Tereso Newcomer, MD;  Location: [facility];  Service: Gynecology;  Laterality: N/A;  . WISDOM TOOTH EXTRACTION       reports that she has been smoking cigarettes. She has a 15.00 pack-year smoking history. She has never used smokeless tobacco. She reports that she  does not drink alcohol and does not use drugs.  Allergies  Allergen Reactions  . Other Itching and Other (See Comments)    Cat dander = Sneezing     Family History  Problem Relation Age of Onset  . Cancer Mother   . Diabetes Father   . Alcohol abuse Father   . Multiple sclerosis Daughter       Prior to Admission medications   Medication Sig Start Date End Date Taking? Authorizing Provider  albuterol (PROVENTIL HFA;VENTOLIN HFA) 108 (90 Base) MCG/ACT inhaler Inhale 2 puffs into the lungs every 6 (six) hours as needed for wheezing or shortness of breath.     [provider]  cephALEXin (KEFLEX) 500 MG capsule Take 1 capsule (500 mg total) by mouth 3 (three) times daily. 07/31/19   Fisher, Roselyn Bering, PA-C  doxycycline (VIBRAMYCIN) 100 MG capsule Take 1 capsule (100 mg total) by mouth 2 (two) times daily for 7 days. 07/31/19 08/07/19  Shaune Pollack, MD  fluconazole (DIFLUCAN) 150 MG tablet Take one now and one in a week 07/31/19   Sherrie Mustache, Roselyn Bering, PA-C  HYDROcodone-acetaminophen (NORCO/VICODIN) 5-325 MG tablet Take 1 tablet by mouth every 6 (six) hours as needed for moderate pain. For acute pain not controlled by regular medication 07/31/19   Sherrie Mustache Roselyn Bering, PA-C  naproxen (NAPROSYN) 500 MG tablet Take 500 mg by mouth 2 (two) times daily with a  meal.     [provider]  omeprazole (PRILOSEC) 40 MG capsule Take 40 mg by mouth as needed (for reflux).     [provider]    Physical Exam: Vitals:   08/06/19 1807 08/06/19 1808 08/07/19 0203 08/07/19 0230  BP: (!) 137/101  (!) 126/88 (!) 131/93  Pulse: (!) 120  (!) 110 (!) 111  Resp: 18  18 16   Temp: 98.5 F (36.9 C)     TempSrc: Oral     SpO2: 98%  100% 99%  Weight:  (!) 104.3 kg    Height:  5\' 6"  (1.676 m)       Vitals:   08/06/19 1807 08/06/19 1808 08/07/19 0203 08/07/19 0230  BP: (!) 137/101  (!) 126/88 (!) 131/93  Pulse: (!) 120  (!) 110 (!) 111  Resp: 18  18 16   Temp: 98.5 F (36.9 C)       TempSrc: Oral     SpO2: 98%  100% 99%  Weight:  (!) 104.3 kg    Height:  5\' 6"  (1.676 m)        Constitutional: Alert and oriented x 3 . Not in any apparent distress HEENT:      Head: Normocephalic and atraumatic.         Eyes: PERLA, EOMI, Conjunctivae are normal. Sclera is non-icteric.       Mouth/Throat: Mucous membranes are moist.       Neck: Supple with no signs of meningismus. Cardiovascular: Regular rate and rhythm. No murmurs, gallops, or rubs. 2+ symmetrical distal pulses are present . No JVD. No LE edema Respiratory: Respiratory effort normal .Lungs sounds clear bilaterally. No wheezes, crackles, or rhonchi.  Gastrointestinal: Soft, non tender, and non distended with positive bowel sounds. No rebound or guarding. Genitourinary: No CVA tenderness. Musculoskeletal:  Pain and swelling left thumb, mostly on palmar surface and periungual area, around site of prior I&D. No cyanosis, or erythema of extremities. Neurologic: Normal speech and language. Face is symmetric. Moving all extremities. No gross focal neurologic deficits . Skin: Skin is warm, dry.  Pain and swelling left thumb, mostly on palmar surface and periungual area, around site of prior I&D. Psychiatric: Mood and affect are normal Speech and behavior are normal   Labs on Admission: I have personally reviewed following labs and imaging studies  CBC: Recent Labs  Lab 07/31/19 1152 08/06/19 1815  WBC 11.2* 16.0*  NEUTROABS 5.8 9.7*  HGB 15.2* 15.0  HCT 42.3 43.5  MCV 77.6* 79.7*  PLT 432* 502*   Basic Metabolic Panel: Recent Labs  Lab 07/31/19 1152 08/06/19 1815  NA 134* 135  K 4.2 3.4*  CL 100 100  CO2 23 23  GLUCOSE 295* 275*  BUN 17 11  CREATININE 0.52 0.63  CALCIUM 9.2 9.1   GFR: Estimated Creatinine Clearance: 117.5 mL/min (by C-G formula based on SCr of 0.63 mg/dL). Liver Function Tests: Recent Labs  Lab 07/31/19 1152 08/06/19 1815  AST 15 17  ALT 16 17  ALKPHOS 113 105  BILITOT 0.4  0.6  PROT 7.2 7.3  ALBUMIN 3.4* 3.5   No results for input(s): LIPASE, AMYLASE in the last 168 hours. No results for input(s): AMMONIA in the last 168 hours. Coagulation Profile: No results for input(s): INR, PROTIME in the last 168 hours. Cardiac Enzymes: No results for input(s): CKTOTAL, CKMB, CKMBINDEX, TROPONINI in the last 168 hours. BNP (last 3 results) No results for input(s): PROBNP in the last 8760 hours. HbA1C: No results  for input(s): HGBA1C in the last 72 hours. CBG: No results for input(s): GLUCAP in the last 168 hours. Lipid Profile: No results for input(s): CHOL, HDL, LDLCALC, TRIG, CHOLHDL, LDLDIRECT in the last 72 hours. Thyroid Function Tests: No results for input(s): TSH, T4TOTAL, FREET4, T3FREE, THYROIDAB in the last 72 hours. Anemia Panel: No results for input(s): VITAMINB12, FOLATE, FERRITIN, TIBC, IRON, RETICCTPCT in the last 72 hours. Urine analysis:    Component Value Date/Time   COLORURINE YELLOW 10/14/2017 0216   APPEARANCEUR CLEAR 10/14/2017 0216   APPEARANCEUR Cloudy 12/12/2011 2049   LABSPEC 1.030 10/14/2017 0216   LABSPEC 1.031 12/12/2011 2049   PHURINE 6.0 10/14/2017 0216   GLUCOSEU >=500 (A) 10/14/2017 0216   GLUCOSEU 50 mg/dL 03/50/0938 1829   HGBUR MODERATE (A) 10/14/2017 0216   BILIRUBINUR NEGATIVE 10/14/2017 0216   BILIRUBINUR Negative 12/12/2011 2049   KETONESUR NEGATIVE 10/14/2017 0216   PROTEINUR 100 (A) 10/14/2017 0216   NITRITE POSITIVE (A) 10/14/2017 0216   LEUKOCYTESUR NEGATIVE 10/14/2017 0216   LEUKOCYTESUR Negative 12/12/2011 2049    Radiological Exams on Admission: No results found.  EKG: Not done in the ER Assessment/Plan 39 year old female with history of diabetes and suspected infection in the hand not improving after several rounds of antibiotics and visits to the ER for drainage of paronychia and felon.    Sepsis (HCC)   Infection of left hand/thumb -Patient presents with recurrent pain of the left thumb not  responding to oral antibiotics and attempted drainage from the emergency room -Follow MRI ordered from the emergency room to evaluate for deep-seated infection -Start IV Zosyn and vancomycin -Consider orthopedic consult based on MRI result    Hyperglycemia due to type 2 diabetes mellitus (HCC) -Blood sugar 275 and no hypoglycemic seen on med rec -Sliding scale insulin coverage -Diabetic educator consult    DVT prophylaxis: Lovenox  Code Status: full code  Family Communication:  none  Disposition Plan: Back to previous home environment Consults called: none  Status:At the time of admission, it appears that the appropriate admission status for this patient is INPATIENT. This is judged to be reasonable and necessary in order to provide the required intensity of service to ensure the patient's safety given the presenting symptoms, physical exam findings, and initial radiographic and laboratory data in the context of their  Comorbid conditions.   Patient requires inpatient status due to high intensity of service, high risk for further deterioration and high frequency of surveillance required.   I certify that at the point of admission it is my clinical judgment that the patient will require inpatient hospital care spanning beyond 2 midnights     Andris Baumann MD [facility]     08/07/2019, 3:27 AM

## 2019-08-07 NOTE — ED Provider Notes (Signed)
[facility] Emergency Department Provider Note  ____________________________________________   First MD Initiated Contact with Patient 08/07/19 0150     (approximate)  I have reviewed the triage vital signs and the nursing notes.   HISTORY  Chief Complaint Hand Pain    HPI Kendra Perkins is a 39 y.o. female with medical history as listed below who presents for evaluation of persistent and in fact worsening pain in her left thumb.  She reports that the pain started 2 to 3 weeks ago and she was on antibiotics for 1 to 2 weeks before she came to the emergency department the first time.  During her first ED visit she had an incision and drainage of a paronychia.  She came back later that same day and was diagnosed with a small felon and had incision and drainage of it on the volar aspect of her left thumb.  Her antibiotics were broadened to include doxycycline and in addition to Keflex and she also received a dose of Rocephin in the ED.  Since that visit about a week ago, she reports the pain is persistent and if anything it is gotten worse.  It is worse with any amount of movement and it radiates up into her hand and even up into the forearm.  There is minimal swelling but the finger itself remains tense.  She says she has a hard time using it at all.  It is not extended or flexed but it is painful to do so.  None of the other fingers are involved.  She denies any obvious fever but said that her heart has been racing and she has felt bad all over.  She has had some nausea but no vomiting.  She is not certain of her last tetanus vaccination.        Past Medical History:  Diagnosis Date  . Arthritis    lower back  . Asthma    rarely uses inhaler  . Back pain 2000  . Depression    no meds  . Diabetes mellitus without complication (HCC)    borderline - no meds  . GERD (gastroesophageal reflux disease)    occasional - diet controlled    Patient Active  Problem List   Diagnosis Date Noted  . Sepsis (HCC) 08/07/2019  . Infection of left hand 08/07/2019  . Hyperglycemia due to type 2 diabetes mellitus (HCC) 08/07/2019  . Type 2 diabetes mellitus without complication (HCC) 11/08/2015  . Rh negative state in antepartum period 11/08/2015  . Missed abortion 11/01/2015  . Supervision of high-risk pregnancy 10/02/2015  . History of pre-eclampsia in prior pregnancy, currently pregnant in first trimester 10/02/2015  . History of gestational diabetes mellitus (GDM) 10/02/2015  . History of cesarean delivery 10/02/2015  . BMI 40.0-44.9, adult (HCC) 10/02/2015  . Obesity in pregnancy 10/02/2015  . Tobacco abuse 10/02/2015  . Chronic low back pain 10/02/2015    Past Surgical History:  Procedure Laterality Date  . APPENDECTOMY     ?. pt thinks she had it removed (laparoscopically)  . CESAREAN SECTION  2002, 2003   x 2  . DILATION AND EVACUATION N/A 11/08/2015   Procedure: DILATATION AND EVACUATION;  Surgeon: Tereso Newcomer, MD;  Location: [facility];  Service: Gynecology;  Laterality: N/A;  . WISDOM TOOTH EXTRACTION      Prior to Admission medications   Medication Sig Start Date End Date Taking? Authorizing Provider  albuterol (PROVENTIL HFA;VENTOLIN HFA) 108 (90 Base) MCG/ACT inhaler  Inhale 2 puffs into the lungs every 6 (six) hours as needed for wheezing or shortness of breath.     [provider]  cephALEXin (KEFLEX) 500 MG capsule Take 1 capsule (500 mg total) by mouth 3 (three) times daily. 07/31/19   Fisher, Roselyn BeringSusan W, PA-C  doxycycline (VIBRAMYCIN) 100 MG capsule Take 1 capsule (100 mg total) by mouth 2 (two) times daily for 7 days. 07/31/19 08/07/19  Shaune PollackIsaacs, Cameron, MD  fluconazole (DIFLUCAN) 150 MG tablet Take one now and one in a week 07/31/19   Sherrie MustacheFisher, Roselyn BeringSusan W, PA-C  HYDROcodone-acetaminophen (NORCO/VICODIN) 5-325 MG tablet Take 1 tablet by mouth every 6 (six) hours as needed for moderate pain. For acute pain not controlled by  regular medication 07/31/19   Sherrie MustacheFisher, Roselyn BeringSusan W, PA-C  naproxen (NAPROSYN) 500 MG tablet Take 500 mg by mouth 2 (two) times daily with a meal.     [provider]  omeprazole (PRILOSEC) 40 MG capsule Take 40 mg by mouth as needed (for reflux).     [provider]    Allergies Other  Family History  Problem Relation Age of Onset  . Cancer Mother   . Diabetes Father   . Alcohol abuse Father   . Multiple sclerosis Daughter     Social History Social History   Tobacco Use  . Smoking status: Current Every Day Smoker    Packs/day: 1.00    Years: 15.00    Pack years: 15.00    Types: Cigarettes  . Smokeless tobacco: Never Used  Substance Use Topics  . Alcohol use: No  . Drug use: No    Review of Systems Constitutional: No fever/chills Eyes: No visual changes. ENT: No sore throat. Cardiovascular: Denies chest pain. Respiratory: Denies shortness of breath. Gastrointestinal: No abdominal pain.  No nausea, no vomiting.  No diarrhea.  No constipation. Genitourinary: Negative for dysuria. Musculoskeletal: Pain in left thumb and radiating into the hand.  Negative for neck pain.  Negative for back pain. Integumentary: Negative for rash. Neurological: Negative for headaches, focal weakness or numbness.   ____________________________________________   PHYSICAL EXAM:  VITAL SIGNS: ED Triage Vitals  Enc Vitals Group     BP 08/06/19 1807 (!) 137/101     Pulse Rate 08/06/19 1807 (!) 120     Resp 08/06/19 1807 18     Temp 08/06/19 1807 98.5 F (36.9 C)     Temp Source 08/06/19 1807 Oral     SpO2 08/06/19 1807 98 %     Weight 08/06/19 1808 (!) 104.3 kg (230 lb)     Height 08/06/19 1808 1.676 m (5\' 6" )     Head Circumference --      Peak Flow --      Pain Score 08/06/19 1808 9     Pain Loc --      Pain Edu? --      Excl. in GC? --     Constitutional: Alert and oriented.  Eyes: Conjunctivae are normal.  Head: Atraumatic. Nose: No  congestion/rhinnorhea. Mouth/Throat: Patient is wearing a mask. Neck: No stridor.  No meningeal signs.   Cardiovascular: Normal rate, regular rhythm. Good peripheral circulation. Grossly normal heart sounds. Respiratory: Normal respiratory effort.  No retractions. Gastrointestinal: Soft and nontender. No distention.  Musculoskeletal: The patient's left thumb does not have significant swelling and is not being held in a state of flexion.  However it is highly tender to palpation.  There is some questionable redness that extends to the hand but  it is minimal.  There is some induration but no obvious fluctuance around the base of the nail in a standard position for paronychia.  There is no clinical evidence to support a diagnosis of flexor tenosynovitis at this time.  Compartments of the hand and the forearm are soft and easily compressible.  No evidence of compartment syndrome.  Minimal streaking if any to suggest a cellulitis. Neurologic:  Normal speech and language. No gross focal neurologic deficits are appreciated.  Skin:  Skin is warm, dry and intact. Psychiatric: Mood and affect are anxious but generally understandable under the circumstances.  ____________________________________________   LABS (all labs ordered are listed, but only abnormal results are displayed)  Labs Reviewed  COMPREHENSIVE METABOLIC PANEL - Abnormal; Notable for the following components:      Result Value   Potassium 3.4 (*)    Glucose, Bld 275 (*)    All other components within normal limits  CBC WITH DIFFERENTIAL/PLATELET - Abnormal; Notable for the following components:   WBC 16.0 (*)    RBC 5.46 (*)    MCV 79.7 (*)    Platelets 502 (*)    Neutro Abs 9.7 (*)    Lymphs Abs 5.1 (*)    Abs Immature Granulocytes 0.11 (*)    All other components within normal limits  LACTIC ACID, PLASMA - Abnormal; Notable for the following components:   Lactic Acid, Venous 2.6 (*)    All other components within normal limits   CULTURE, BLOOD (ROUTINE X 2)  CULTURE, BLOOD (ROUTINE X 2)  URINE CULTURE  SARS CORONAVIRUS 2 BY RT PCR (HOSPITAL ORDER, PERFORMED IN [facility])  PROCALCITONIN  URINALYSIS, COMPLETE (UACMP) WITH MICROSCOPIC  HIV ANTIBODY (ROUTINE TESTING W REFLEX)  PROTIME-INR  CORTISOL-AM, BLOOD  PROCALCITONIN  CBC  CREATININE, SERUM  HEMOGLOBIN A1C  LACTIC ACID, PLASMA  LACTIC ACID, PLASMA   ____________________________________________  EKG  No indication for emergent EKG ____________________________________________  RADIOLOGY I, Loleta Rose, personally viewed and evaluated these images (plain radiographs) as part of my medical decision making, as well as reviewing the written report by the radiologist.  ED MD interpretation: MRI of the left hand is pending at the time of admission   ____________________________________________   PROCEDURES   Procedure(s) performed (including Critical Care):  .Critical Care Performed by: Loleta Rose, MD Authorized by: Loleta Rose, MD   Critical care provider statement:    Critical care time (minutes):  30   Critical care time was exclusive of:  Separately billable procedures and treating other patients   Critical care was necessary to treat or prevent imminent or life-threatening deterioration of the following conditions:  Sepsis   Critical care was time spent personally by me on the following activities:  Development of treatment plan with patient or surrogate, discussions with consultants, evaluation of patient's response to treatment, examination of patient, obtaining history from patient or surrogate, ordering and performing treatments and interventions, ordering and review of laboratory studies, ordering and review of radiographic studies, pulse oximetry, re-evaluation of patient's condition and review of old charts  .1-3 Lead EKG Interpretation Performed by: Loleta Rose, MD Authorized by: Loleta Rose, MD      Interpretation: abnormal     ECG rate:  122   ECG rate assessment: tachycardic     Rhythm: sinus tachycardia     Ectopy: none     Conduction: normal       ____________________________________________   INITIAL IMPRESSION / MDM / ASSESSMENT AND PLAN / ED  COURSE  As part of my medical decision making, I reviewed the following data within the electronic MEDICAL RECORD NUMBER Nursing notes reviewed and incorporated, Labs reviewed , Old chart reviewed, Discussed with admitting physician (Dr. Para March) and Notes from prior ED visits   Differential diagnosis includes, but is not limited to, cellulitis, flexor tenosynovitis, paronychia, felon, sepsis.  The patient has no other infectious signs or symptoms other than her phone.  However, she meets sepsis criteria easily with a leukocytosis, heart rate in the 120s - 130s, lactic acid of 2.6, and a source of infection.  She has had at least 2 if not 3 incision and drainages of the and she seems to be worsening in spite of multiple outpatient antibiotics.  The patient is on the cardiac monitor to evaluate for evidence of arrhythmia and/or significant heart rate changes.  Although at this time I do not feel that she has any clinical evidence to support a diagnosis of flexor tenosynovitis nor compartment syndrome, I am concerned about the possibility of a deep space infection in the thumb that is not being adequately treated by outpatient antibiotics.  I have made her code sepsis and and giving Zosyn 3.375 g IV and vancomycin 1 g IV with pharmacy consult for broad antibiotic coverage including pseudomonal coverage since she is a diabetic.  She is getting 2 L of lactated Ringer's as a target for ideal body weight.  She is getting morphine 4 mg IV and Zofran 4 mg IV.  I will discussed the case with Dr. Para March with the hospitalist service for admission.  Of note, she had radiographs last week that were unremarkable.  Given the possibility of a deep space  infection, I have ordered an MRI of her hand with and without contrast to evaluate further.  This imaging will not be completed or interpreted by the time the patient is admitted but should be available for the hospitalist and presumed orthopedics consult.     Clinical Course as of Aug 07 346  Sat Aug 07, 2019  [id number] Discussed case by phone with Dr. Para March who will admit.   [CF]    Clinical Course User Index [CF] Loleta Rose, MD     ____________________________________________  FINAL CLINICAL IMPRESSION(S) / ED DIAGNOSES  Final diagnoses:  Sepsis, due to unspecified organism, unspecified whether acute organ dysfunction present (HCC)  Cellulitis of thumb, left     MEDICATIONS GIVEN DURING THIS VISIT:  Medications  lactated ringers bolus 2,000 mL (2,000 mLs Intravenous New Bag/Given 08/07/19 0247)  vancomycin (VANCOCIN) IVPB 1000 mg/200 mL premix (1,000 mg Intravenous New Bag/Given 08/07/19 0305)  Tdap (BOOSTRIX) injection 0.5 mL (has no administration in time range)  insulin aspart (novoLOG) injection 0-15 Units (has no administration in time range)  insulin aspart (novoLOG) injection 0-5 Units (has no administration in time range)  enoxaparin (LOVENOX) injection 40 mg (has no administration in time range)  0.9 %  sodium chloride infusion (has no administration in time range)  acetaminophen (TYLENOL) tablet 650 mg (has no administration in time range)    Or  acetaminophen (TYLENOL) suppository 650 mg (has no administration in time range)  HYDROcodone-acetaminophen (NORCO/VICODIN) 5-325 MG per tablet 1-2 tablet (has no administration in time range)  ketorolac (TORADOL) 30 MG/ML injection 30 mg (has no administration in time range)  ondansetron (ZOFRAN) tablet 4 mg (has no administration in time range)    Or  ondansetron (ZOFRAN) injection 4 mg (has no administration in time range)  piperacillin-tazobactam (ZOSYN) IVPB  3.375 g (has no administration in time range)  vancomycin  (VANCOCIN) IVPB 1000 mg/200 mL premix (has no administration in time range)  piperacillin-tazobactam (ZOSYN) IVPB 3.375 g (0 g Intravenous Stopped 08/07/19 0310)  morphine 4 MG/ML injection 4 mg (4 mg Intravenous Given 08/07/19 0259)  ondansetron (ZOFRAN) injection 4 mg (4 mg Intravenous Given 08/07/19 0259)     ED Discharge Orders    None      *Please note:  TERENCE GOOGE was evaluated in Emergency Department on 08/07/2019 for the symptoms described in the history of present illness. She was evaluated in the context of the global COVID-19 pandemic, which necessitated consideration that the patient might be at risk for infection with the SARS-CoV-2 virus that causes COVID-19. Institutional protocols and algorithms that pertain to the evaluation of patients at risk for COVID-19 are in a state of rapid change based on information released by regulatory bodies including the CDC and federal and state organizations. These policies and algorithms were followed during the patient's care in the ED.  Some ED evaluations and interventions may be delayed as a result of limited staffing during and after the pandemic.*  Note:  This document was prepared using Dragon voice recognition software and may include unintentional dictation errors.   Loleta Rose, MD 08/07/19 [phone]

## 2019-08-07 NOTE — ED Notes (Signed)
Pt states she has been seen x3 for her thumn on her left hand to be drained. Pt reports that her thumb has been drained twice. And has taken 3 diferent abx, reports pressure and pain in the thumb

## 2019-08-07 NOTE — ED Notes (Signed)
Will collect lactic acid after pt receives fluids. Told to do so by Dr. Jeanene Erb

## 2019-08-08 LAB — BASIC METABOLIC PANEL
Anion gap: 8 (ref 5–15)
BUN: 14 mg/dL (ref 6–20)
CO2: 26 mmol/L (ref 22–32)
Calcium: 8.5 mg/dL — ABNORMAL LOW (ref 8.9–10.3)
Chloride: 107 mmol/L (ref 98–111)
Creatinine, Ser: 0.63 mg/dL (ref 0.44–1.00)
GFR calc Af Amer: >60 mL/min (ref >60)
GFR calc non Af Amer: >60 mL/min (ref >60)
Glucose, Bld: 196 mg/dL — ABNORMAL HIGH (ref 70–99)
Potassium: 3.5 mmol/L (ref 3.5–5.1)
Sodium: 141 mmol/L (ref 135–145)

## 2019-08-08 LAB — CBC
HCT: 37.8 % (ref 36.0–46.0)
Hemoglobin: 13.4 g/dL (ref 12.0–15.0)
MCH: 28 pg (ref 26.0–34.0)
MCHC: 35.4 g/dL (ref 30.0–36.0)
MCV: 79.1 fL — ABNORMAL LOW (ref 80.0–100.0)
Platelets: 417 10*3/uL — ABNORMAL HIGH (ref 150–400)
RBC: 4.78 MIL/uL (ref 3.87–5.11)
RDW: 12.9 % (ref 11.5–15.5)
WBC: 13 10*3/uL — ABNORMAL HIGH (ref 4.0–10.5)
nRBC: 0 % (ref 0.0–0.2)

## 2019-08-08 LAB — GLUCOSE, CAPILLARY
Glucose-Capillary: 214 mg/dL — ABNORMAL HIGH (ref 70–99)
Glucose-Capillary: 190 mg/dL — ABNORMAL HIGH (ref 70–99)
Glucose-Capillary: 273 mg/dL — ABNORMAL HIGH (ref 70–99)
Glucose-Capillary: 249 mg/dL — ABNORMAL HIGH (ref 70–99)

## 2019-08-08 LAB — URINE CULTURE: Culture: <10000 — AB

## 2019-08-08 LAB — MAGNESIUM: Magnesium: 1.2 mg/dL — ABNORMAL LOW (ref 1.7–2.4)

## 2019-08-08 LAB — LACTIC ACID, PLASMA: Lactic Acid, Venous: 2.5 mmol/L (ref 0.5–1.9)

## 2019-08-08 MED ORDER — INSULIN ASPART 100 UNIT/ML ~~LOC~~ SOLN
7.0000 [IU] | Freq: Three times a day (TID) | SUBCUTANEOUS | Status: DC
  Administered 2019-08-09 – 2019-08-11 (×7): 7 [IU] via SUBCUTANEOUS
  Filled 2019-08-08 (×7): qty 1

## 2019-08-08 MED ORDER — MAGNESIUM SULFATE 2 GM/50ML IV SOLN
2.0000 g | INTRAVENOUS | Status: AC
  Administered 2019-08-08 (×2): 2 g via INTRAVENOUS
  Filled 2019-08-08 (×2): qty 50

## 2019-08-08 MED ORDER — INSULIN DETEMIR 100 UNIT/ML ~~LOC~~ SOLN
30.0000 [IU] | Freq: Every day | SUBCUTANEOUS | Status: DC
  Administered 2019-08-09 – 2019-08-11 (×3): 30 [IU] via SUBCUTANEOUS
  Filled 2019-08-08 (×3): qty 0.3

## 2019-08-08 MED ORDER — FLUCONAZOLE 100 MG PO TABS
100.0000 mg | ORAL_TABLET | Freq: Every day | ORAL | Status: AC
  Administered 2019-08-08 – 2019-08-10 (×3): 100 mg via ORAL
  Filled 2019-08-08 (×5): qty 1

## 2019-08-08 NOTE — Consult Note (Signed)
ORTHOPAEDIC CONSULTATION  REQUESTING PHYSICIAN: Darlin Priestly, MD  Chief Complaint: Left thumb pain  HPI: Kendra Perkins is a 39 y.o. female with diabetes who complains of pain in the left thumb.  Patient was seen initially at the [facility] 2 weeks ago and given Septra for a presumed infection in the distal left thumb.  Patient presented to the ER on 07/31/2019 with persistent pain and swelling in the left thumb.  Dr. Josephine Igo was on call and contacted.  The patient was felt to have a paronychia and received IV Ancef and discharged on Keflex.  Patient return to the ER later that day and had drainage of a felon and was again discharged home on Keflex and doxycycline after receiving a dose of Rocephin in the ED.  Patient again return to ER on 08/07/2019.  Patient was admitted to the hospital service for IV antibiotics.  Her white count was 13 on admission with blood glucose levels in the 200s and tachycardia.  Patient is currently on IV vancomycin and Zosyn.  She is on an insulin sliding scale.  Patient had an MRI of the left hand which demonstrates diffuse T2 hyperintensity and enhancement throughout the soft tissues of the distal thumb including the subungual region consistent with soft tissue infection.  There is no focal collection or foreign body identified.  Patient had prominent marrow hyperintensity and enhancement with within the distal phalanx of the thumb without cortical destruction which the radiologist states were suspicious for early osteomyelitis.  There is no evidence of a septic joint.  Patient is seen in her hospital room today and continues to complain of significant pain in the left thumb.  Past Medical History:  Diagnosis Date  . Arthritis    lower back  . Asthma    rarely uses inhaler  . Back pain 2000  . Depression    no meds  . Diabetes mellitus without complication (HCC)    borderline - no meds  . GERD (gastroesophageal reflux disease)    occasional - diet  controlled   Past Surgical History:  Procedure Laterality Date  . APPENDECTOMY     ?. pt thinks she had it removed (laparoscopically)  . CESAREAN SECTION  2002, 2003   x 2  . DILATION AND EVACUATION N/A 11/08/2015   Procedure: DILATATION AND EVACUATION;  Surgeon: Tereso Newcomer, MD;  Location: [facility];  Service: Gynecology;  Laterality: N/A;  . WISDOM TOOTH EXTRACTION     Social History   Socioeconomic History  . Marital status: Married    Spouse name: Not on file  . Number of children: Not on file  . Years of education: Not on file  . Highest education level: Not on file  Occupational History  . Not on file  Tobacco Use  . Smoking status: Current Every Day Smoker    Packs/day: 1.00    Years: 15.00    Pack years: 15.00    Types: Cigarettes  . Smokeless tobacco: Never Used  Substance and Sexual Activity  . Alcohol use: No  . Drug use: No  . Sexual activity: Yes    Birth control/protection: None    Comment: 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  Other Topics Concern  . Not on file  Social History Narrative  . Not on file   Social Determinants of Health   Financial Resource Strain:   . Difficulty of Paying Living Expenses:   Food Insecurity:   . Worried About Programme researcher, broadcasting/film/video in  the Last Year:   . Ran Out of Food in the Last Year:   Transportation Needs:   . Freight forwarder (Medical):   Marland Kitchen Lack of Transportation (Non-Medical):   Physical Activity:   . Days of Exercise per Week:   . Minutes of Exercise per Session:   Stress:   . Feeling of Stress :   Social Connections:   . Frequency of Communication with Friends and Family:   . Frequency of Social Gatherings with Friends and Family:   . Attends Religious Services:   . Active Member of Clubs or Organizations:   . Attends Banker Meetings:   Marland Kitchen Marital Status:    Family History  Problem Relation Age of Onset  . Cancer Mother   . Diabetes Father   . Alcohol abuse Father   . Multiple sclerosis  Daughter    Allergies  Allergen Reactions  . Other Itching and Other (See Comments)    Cat dander = Sneezing    Prior to Admission medications   Medication Sig Start Date End Date Taking? Authorizing Provider  albuterol (PROVENTIL HFA;VENTOLIN HFA) 108 (90 Base) MCG/ACT inhaler Inhale 2 puffs into the lungs every 6 (six) hours as needed for wheezing or shortness of breath.    Yes [provider]  cyclobenzaprine (FLEXERIL) 5 MG tablet Take 5 mg by mouth at bedtime as needed for muscle spasms.   Yes [provider]  HYDROcodone-acetaminophen (NORCO/VICODIN) 5-325 MG tablet Take 1 tablet by mouth every 6 (six) hours as needed for moderate pain. For acute pain not controlled by regular medication 07/31/19  Yes Fisher, Roselyn Bering, PA-C  insulin detemir (LEVEMIR) 100 UNIT/ML injection Inject 35 Units into the skin daily.   Yes [provider]  naproxen (NAPROSYN) 500 MG tablet Take 500 mg by mouth 2 (two) times daily as needed for mild pain or moderate pain.    Yes [provider]  omeprazole (PRILOSEC) 40 MG capsule Take 40 mg by mouth as needed (for reflux).    Yes [provider]  traMADol (ULTRAM) 50 MG tablet Take 50-100 mg by mouth every 6 (six) hours as needed for moderate pain or severe pain.   Yes [provider]  lisinopril (ZESTRIL) 10 MG tablet Take 10 mg by mouth daily.    [provider]   MR HAND LEFT W WO CONTRAST  Result Date: 08/07/2019 CLINICAL DATA:  Progressive left thumb for 2-3 weeks. Previous incision and drainage approximately 1 week ago. On antibiotics. Soft tissue infection suspected. EXAM: MRI OF THE LEFT HAND WITHOUT AND WITH CONTRAST TECHNIQUE: Multiplanar, multisequence MR imaging of the left thumb was performed before and after the administration of intravenous contrast. CONTRAST:  20mL GADAVIST GADOBUTROL 1 MMOL/ML IV SOLN COMPARISON:  Radiograph 07/31/2019 FINDINGS: Bones/Joint/Cartilage Examination includes  most of the left hand, concentrating on the thumb. There is intense marrow T2 hyperintensity and moderate enhancement within the distal phalanx of the thumb. There is equivocal decreased T1 marrow signal. No cortical destruction is identified. The proximal phalanx and additional digits appear normal. There are no suspicious findings within the metacarpals or carpal bones. There is a small intraosseous ganglion within the neck of the 1st metacarpal. No significant joint effusions or suspicious synovial enhancement. Ligaments The collateral ligaments of the metacarpal phalangeal joints appear intact. Muscles and Tendons Mild ECU tendinosis. The additional wrist and hand tendons appear intact without tenosynovitis or abnormal enhancement. No focal muscular abnormality or abnormal enhancement. Soft tissues Possible  small focus of skin ulceration along the palmar aspect of the distal thumb. There is underlying diffuse T2 hyperintensity and enhancement throughout the soft tissues of the distal thumb, including this subungual region. No focal fluid collection or foreign body identified. No other significant soft tissue findings. IMPRESSION: 1. Diffuse T2 hyperintensity and enhancement throughout the soft tissues of the distal thumb, including this subungual region, consistent with soft tissue infection. No focal fluid collection or foreign body identified. 2. Prominent marrow T2 hyperintensity and enhancement within the distal phalanx of the thumb without cortical destruction, suspicious for early osteomyelitis. 3. No evidence of septic joint or other significant osseous findings. 4. Preliminary report of these findings was rendered by Dr. Chase Picket at [phone] hours. Electronically Signed   By: Carey Bullocks M.D.   On: 08/07/2019 08:55    Positive ROS: All other systems have been reviewed and were otherwise negative with the exception of those mentioned in the HPI and as above.  Physical Exam: General: Alert, no acute  distress  MUSCULOSKELETAL: Left thumb/hand: Patient has healed volar incision over the left distal phalanx.  She also has a healed incision along the ulnar side of her nail plate from her previous I&D's.  Patient demonstrates no significant swelling.  She has tenderness over the distal phalanx.  The nail plate is intact.  She has intact sensation to light touch.  Patient is able to flex and extend her finger with mild pain.  There is no evidence for flexor tenosynovitis.  There is no significant erythema and no ecchymosis.  The distal phalanx of the left thumb also does not have focal erythema or significant swelling today.  Assessment: Left thumb soft tissue infection  Plan: I have examined the patient and reviewed her MRI findings.  Patient has diffuse soft tissue edema consistent with cellulitis.  The edema does not extend proximal to the distal phalanx.  The distal phalanx has T2 hyper intensity which may be consistent with early osteomyelitis.  There is no cortical destruction. There is no focal abscess seen.   I recommend continued IV antibiotics with Zosyn and Vanco as ordered by the hospitalist service.  I do not recommend surgical intervention at this time.  Continue strict elevation.  Patient may apply ice to the left thumb to help with swelling.  I will continue to follow.    Juanell Fairly, MD    08/08/2019 11:09 AM

## 2019-08-08 NOTE — Plan of Care (Signed)
  Problem: Education: Goal: Knowledge of General Education information will improve Description Including pain rating scale, medication(s)/side effects and non-pharmacologic comfort measures Outcome: Progressing   

## 2019-08-08 NOTE — Progress Notes (Addendum)
PROGRESS NOTE    Kendra Perkins  ZOX:[id number] DOB: [birth year] DOA: 08/07/2019 [facility]    Assessment & Plan:   Principal Problem:   Sepsis (HCC) Active Problems:   Infection of left hand   Hyperglycemia due to type 2 diabetes mellitus (HCC)    Kendra Perkins is a 39 y.o. female with medical history significant for diabetes, with infection of the distal left thumb for which she has had repeated visits to the emergency room with drainage of paronychia followed by drainage for possible felon, on outpatient antibiotic treatment who returns to the emergency room because of continued pain at the site of prior drainage, now extending to the palmar surface of the hand.  She denies fever or chills.    Sepsis (HCC) Early Osteomyelitis of left thumb -tachycardia, leukocytosis, elevated lactic acid.  Patient presents with recurrent pain of the left thumb not responding to oral antibiotics and attempted drainage from the emergency room --MRI showed early osteo -Start IV Zosyn and vancomycin PLAN: --continue IV vanc/zosyn, per ortho rec --conservative management for now  Lactic acidosis, persistent --presumed due to infection, however, remained persistently between 2-3 despite IVF --continue MIVF@100  --trend lactic acid    Hyperglycemia due to type 2 diabetes mellitus (HCC) --reduce home Levemir to 30u daily (down from 35) --ACHS and SSI TID --add meal-time 7u TID  GERD --continue home PPI   DVT prophylaxis: Lovenox SQ Code Status: Full code  Family Communication: daughter updated at bedside today Status is: inpatient Dispo:   The patient is from: home Anticipated d/c is to: home Anticipated d/c date is: unclear Patient currently is not medically stable to d/c due to: on IV empiric abx for now, per ortho rec   Subjective and Interval History:  Pt was sleepy today.  Daughter at bedside reported no issues for pt.    Ortho saw pt and  recommended conservative tx with abx.   Objective: Vitals:   08/07/19 1218 08/07/19 1620 08/07/19 2308 08/08/19 0802  BP: (!) 117/91 127/81 (!) 137/91 109/67  Pulse: 92 101 92 82  Resp: 16 17 17 18   Temp: 98 F (36.7 C) 98 F (36.7 C) 97.9 F (36.6 C) 98 F (36.7 C)  TempSrc: Oral Oral Oral Oral  SpO2: 98% 98% 100% 98%  Weight:      Height:        Intake/Output Summary (Last 24 hours) at 08/08/2019 1329 Last data filed at 08/08/2019 0955 Gross per 24 hour  Intake 2840.04 ml  Output --  Net 2840.04 ml   Filed Weights   08/06/19 1808 08/07/19 0847  Weight: (!) 104.3 kg (!) 108.6 kg    Examination:   Constitutional: NAD, sleepy today HEENT: conjunctivae and lids normal, EOMI CV: RRR no M,R,G. Distal pulses +2.  No cyanosis.   RESP: CTA B/L, normal respiratory effort  GI: +BS, NTND Extremities: No effusions, edema, or tenderness in BLE MSK: left thumb no significant swelling, 2 incision marks on both sides of the thumb SKIN: warm, dry     Data Reviewed: I have personally reviewed following labs and imaging studies  CBC: Recent Labs  Lab 08/06/19 1815 08/07/19 0343 08/08/19 0406  WBC 16.0* 16.8* 13.0*  NEUTROABS 9.7*  --   --   HGB 15.0 13.5 13.4  HCT 43.5 39.2 37.8  MCV 79.7* 79.8* 79.1*  PLT 502* 470* 417*   Basic Metabolic Panel: Recent Labs  Lab 08/06/19 1815 08/07/19 0343 08/08/19 0406  NA  135  --  141  K 3.4*  --  3.5  CL 100  --  107  CO2 23  --  26  GLUCOSE 275*  --  196*  BUN 11  --  14  CREATININE 0.63 0.66 0.63  CALCIUM 9.1  --  8.5*  MG  --   --  1.2*   GFR: Estimated Creatinine Clearance: 120.1 mL/min (by C-G formula based on SCr of 0.63 mg/dL). Liver Function Tests: Recent Labs  Lab 08/06/19 1815  AST 17  ALT 17  ALKPHOS 105  BILITOT 0.6  PROT 7.3  ALBUMIN 3.5   No results for input(s): LIPASE, AMYLASE in the last 168 hours. No results for input(s): AMMONIA in the last 168 hours. Coagulation Profile: Recent Labs  Lab  08/07/19 0343  INR 1.0   Cardiac Enzymes: No results for input(s): CKTOTAL, CKMB, CKMBINDEX, TROPONINI in the last 168 hours. BNP (last 3 results) No results for input(s): PROBNP in the last 8760 hours. HbA1C: Recent Labs    08/07/19 0343  HGBA1C 10.6*   CBG: Recent Labs  Lab 08/07/19 1432 08/07/19 1815 08/07/19 2139 08/08/19 0800 08/08/19 1132  GLUCAP 260* 281* 232* 214* 190*   Lipid Profile: No results for input(s): CHOL, HDL, LDLCALC, TRIG, CHOLHDL, LDLDIRECT in the last 72 hours. Thyroid Function Tests: No results for input(s): TSH, T4TOTAL, FREET4, T3FREE, THYROIDAB in the last 72 hours. Anemia Panel: No results for input(s): VITAMINB12, FOLATE, FERRITIN, TIBC, IRON, RETICCTPCT in the last 72 hours. Sepsis Labs: Recent Labs  Lab 08/06/19 2008 08/07/19 0233 08/07/19 0343 08/07/19 1112 08/08/19 0959  PROCALCITON  --  <0.10 <0.10  --   --   LATICACIDVEN 2.6*  --  2.7* 2.5* 2.5*    Recent Results (from the past 240 hour(s))  Blood Culture (routine x 2)     Status: None (Preliminary result)   Collection Time: 08/07/19  2:34 AM   Specimen: BLOOD  Result Value Ref Range Status   Specimen Description BLOOD BLOOD RIGHT HAND  Final   Special Requests   Final    BOTTLES DRAWN AEROBIC AND ANAEROBIC Blood Culture adequate volume   Culture   Final    NO GROWTH 1 DAY Performed at [facility], [address] [place], [place] [address]    Report Status PENDING  Incomplete  Blood Culture (routine x 2)     Status: None (Preliminary result)   Collection Time: 08/07/19  2:34 AM   Specimen: BLOOD  Result Value Ref Range Status   Specimen Description BLOOD RIGHT ANTECUBITAL  Final   Special Requests   Final    BOTTLES DRAWN AEROBIC AND ANAEROBIC Blood Culture adequate volume   Culture   Final    NO GROWTH 1 DAY Performed at [facility], [address], [place], [place] [address]    Report Status PENDING  Incomplete  SARS Coronavirus 2 by RT PCR  (hospital order, performed in [facility] hospital lab) Nasopharyngeal Nasopharyngeal Swab     Status: None   Collection Time: 08/07/19  3:06 AM   Specimen: Nasopharyngeal Swab  Result Value Ref Range Status   SARS Coronavirus 2 NEGATIVE NEGATIVE Final    Comment: (NOTE) SARS-CoV-2 target nucleic acids are NOT DETECTED.  The SARS-CoV-2 RNA is generally detectable in upper and lower respiratory specimens during the acute phase of infection. The lowest concentration of SARS-CoV-2 viral copies this assay can detect is 250 copies / mL. A negative result does not preclude SARS-CoV-2  infection and should not be used as the sole basis for treatment or other patient management decisions.  A negative result may occur with improper specimen collection / handling, submission of specimen other than nasopharyngeal swab, presence of viral mutation(s) within the areas targeted by this assay, and inadequate number of viral copies (<250 copies / mL). A negative result must be combined with clinical observations, patient history, and epidemiological information.  Fact Sheet for Patients:   BoilerBrush.com.cy  Fact Sheet for Healthcare Providers: https://pope.com/  This test is not yet approved or  cleared by the [place] FDA and has been authorized for detection and/or diagnosis of SARS-CoV-2 by FDA under an Emergency Use Authorization (EUA).  This EUA will remain in effect (meaning this test can be used) for the duration of the COVID-19 declaration under Section 564(b)(1) of the Act, 21 U.S.C. section 360bbb-3(b)(1), unless the authorization is terminated or revoked sooner.  Performed at [facility], [address] [place], [place] [address]   Urine culture     Status: Abnormal   Collection Time: 08/07/19  5:21 AM   Specimen: In/Out Cath Urine  Result Value Ref Range Status   Specimen Description   Final    IN/OUT CATH  URINE Performed at [facility], [address] [place], [place] [address]    Special Requests   Final    NONE Performed at [facility], [address] [place], [place] [address]    Culture (A)  Final    <10,000 COLONIES/mL INSIGNIFICANT GROWTH Performed at [facility], [address], [place], [place] [address]    Report Status 08/08/2019 FINAL  Final      Radiology Studies: MR HAND LEFT W WO CONTRAST  Result Date: 08/07/2019 CLINICAL DATA:  Progressive left thumb for 2-3 weeks. Previous incision and drainage approximately 1 week ago. On antibiotics. Soft tissue infection suspected. EXAM: MRI OF THE LEFT HAND WITHOUT AND WITH CONTRAST TECHNIQUE: Multiplanar, multisequence MR imaging of the left thumb was performed before and after the administration of intravenous contrast. CONTRAST:  49mL GADAVIST GADOBUTROL 1 MMOL/ML IV SOLN COMPARISON:  Radiograph 07/31/2019 FINDINGS: Bones/Joint/Cartilage Examination includes most of the left hand, concentrating on the thumb. There is intense marrow T2 hyperintensity and moderate enhancement within the distal phalanx of the thumb. There is equivocal decreased T1 marrow signal. No cortical destruction is identified. The proximal phalanx and additional digits appear normal. There are no suspicious findings within the metacarpals or carpal bones. There is a small intraosseous ganglion within the neck of the 1st metacarpal. No significant joint effusions or suspicious synovial enhancement. Ligaments The collateral ligaments of the metacarpal phalangeal joints appear intact. Muscles and Tendons Mild ECU tendinosis. The additional wrist and hand tendons appear intact without tenosynovitis or abnormal enhancement. No focal muscular abnormality or abnormal enhancement. Soft tissues Possible small focus of skin ulceration along the palmar aspect of the distal thumb. There is underlying diffuse T2 hyperintensity and enhancement throughout  the soft tissues of the distal thumb, including this subungual region. No focal fluid collection or foreign body identified. No other significant soft tissue findings. IMPRESSION: 1. Diffuse T2 hyperintensity and enhancement throughout the soft tissues of the distal thumb, including this subungual region, consistent with soft tissue infection. No focal fluid collection or foreign body identified. 2. Prominent marrow T2 hyperintensity and enhancement within the distal phalanx of the thumb without cortical destruction, suspicious for early osteomyelitis. 3. No evidence of septic joint or other significant osseous  findings. 4. Preliminary report of these findings was rendered by Dr. Chase Picket at [phone] hours. Electronically Signed   By: Carey Bullocks M.D.   On: 08/07/2019 08:55     Scheduled Meds: . enoxaparin (LOVENOX) injection  40 mg Subcutaneous Q24H  . fluconazole  100 mg Oral Daily  . insulin aspart  0-15 Units Subcutaneous TID WC  . insulin detemir  35 Units Subcutaneous Daily  . pantoprazole  40 mg Oral Daily   Continuous Infusions: . sodium chloride 100 mL/hr at 08/08/19 0600  . magnesium sulfate bolus IVPB 2 g (08/08/19 1312)  . piperacillin-tazobactam (ZOSYN)  IV 3.375 g (08/08/19 1129)  . vancomycin 1,000 mg (08/08/19 0753)     LOS: 1 day     Darlin Priestly, MD [facility] If 7PM-7AM, please contact night-coverage 08/08/2019, 1:29 PM

## 2019-08-09 DIAGNOSIS — Z794 Long term (current) use of insulin: Secondary | ICD-10-CM

## 2019-08-09 DIAGNOSIS — E1165 Type 2 diabetes mellitus with hyperglycemia: Secondary | ICD-10-CM

## 2019-08-09 DIAGNOSIS — L03012 Cellulitis of left finger: Secondary | ICD-10-CM | POA: Diagnosis not present

## 2019-08-09 LAB — BASIC METABOLIC PANEL
Anion gap: 5 (ref 5–15)
BUN: 12 mg/dL (ref 6–20)
CO2: 26 mmol/L (ref 22–32)
Calcium: 8.1 mg/dL — ABNORMAL LOW (ref 8.9–10.3)
Chloride: 110 mmol/L (ref 98–111)
Creatinine, Ser: 0.57 mg/dL (ref 0.44–1.00)
GFR calc Af Amer: >60 mL/min (ref >60)
GFR calc non Af Amer: >60 mL/min (ref >60)
Glucose, Bld: 224 mg/dL — ABNORMAL HIGH (ref 70–99)
Potassium: 3.9 mmol/L (ref 3.5–5.1)
Sodium: 141 mmol/L (ref 135–145)

## 2019-08-09 LAB — CBC
HCT: 34.1 % — ABNORMAL LOW (ref 36.0–46.0)
Hemoglobin: 11.4 g/dL — ABNORMAL LOW (ref 12.0–15.0)
MCH: 27.5 pg (ref 26.0–34.0)
MCHC: 33.4 g/dL (ref 30.0–36.0)
MCV: 82.2 fL (ref 80.0–100.0)
Platelets: 370 10*3/uL (ref 150–400)
RBC: 4.15 MIL/uL (ref 3.87–5.11)
RDW: 13 % (ref 11.5–15.5)
WBC: 11.9 10*3/uL — ABNORMAL HIGH (ref 4.0–10.5)
nRBC: 0 % (ref 0.0–0.2)

## 2019-08-09 LAB — GLUCOSE, CAPILLARY
Glucose-Capillary: 208 mg/dL — ABNORMAL HIGH (ref 70–99)
Glucose-Capillary: 238 mg/dL — ABNORMAL HIGH (ref 70–99)
Glucose-Capillary: 184 mg/dL — ABNORMAL HIGH (ref 70–99)
Glucose-Capillary: 197 mg/dL — ABNORMAL HIGH (ref 70–99)

## 2019-08-09 LAB — MAGNESIUM: Magnesium: 1.7 mg/dL (ref 1.7–2.4)

## 2019-08-09 MED ORDER — TRAMADOL HCL 50 MG PO TABS
50.0000 mg | ORAL_TABLET | Freq: Four times a day (QID) | ORAL | Status: DC | PRN
  Administered 2019-08-09 – 2019-08-11 (×8): 100 mg via ORAL
  Filled 2019-08-09: qty 1
  Filled 2019-08-09 (×7): qty 2
  Filled 2019-08-09: qty 1

## 2019-08-09 MED ORDER — BUTALBITAL-APAP-CAFFEINE 50-325-40 MG PO TABS
1.0000 | ORAL_TABLET | Freq: Two times a day (BID) | ORAL | Status: DC | PRN
  Administered 2019-08-10 – 2019-08-11 (×2): 1 via ORAL
  Filled 2019-08-09 (×5): qty 1

## 2019-08-09 NOTE — Progress Notes (Signed)
Subjective:  Patient still complains of pain in the left thumb today.  Her daughter is at the bedside.  Patient has her left hand elevated on blankets.  Objective:   VITALS:   Vitals:   08/09/19 0745 08/09/19 0918 08/09/19 1141 08/09/19 1621  BP: 118/82 109/69 109/72 (!) 140/90  Pulse: 79 82 88 79  Resp: 18 16 16 18   Temp: 97.6 F (36.4 C) 98.1 F (36.7 C) 98.2 F (36.8 C) 97.9 F (36.6 C)  TempSrc: Oral Oral Oral Oral  SpO2: 98% 98% 99% 98%  Weight:      Height:        PHYSICAL EXAM: Left hand: Left thumb has healed incisions from her prior I&D's..  There is no drainage.  There is no erythema or ecchymosis.  Patient has intact sensation light touch but has tenderness to palpation of the pulp of the left thumb.  There is no focal abscess seen.  Patient can extend extend at the IP joint.  The nail plate is intact.  LABS  Results for orders placed or performed during the hospital encounter of 08/07/19 (from the past 24 hour(s))  Glucose, capillary     Status: Abnormal   Collection Time: 08/08/19  9:11 PM  Result Value Ref Range   Glucose-Capillary 249 (H) 70 - 99 mg/dL   Comment 1 Notify RN   Basic metabolic panel     Status: Abnormal   Collection Time: 08/09/19  4:12 AM  Result Value Ref Range   Sodium 141 135 - 145 mmol/L   Potassium 3.9 3.5 - 5.1 mmol/L   Chloride 110 98 - 111 mmol/L   CO2 26 22 - 32 mmol/L   Glucose, Bld 224 (H) 70 - 99 mg/dL   BUN 12 6 - 20 mg/dL   Creatinine, Ser 08/11/19 0.44 - 1.00 mg/dL   Calcium 8.1 (L) 8.9 - 10.3 mg/dL   GFR calc non Af Amer >60 >60 mL/min   GFR calc Af Amer >60 >60 mL/min   Anion gap 5 5 - 15  CBC     Status: Abnormal   Collection Time: 08/09/19  4:12 AM  Result Value Ref Range   WBC 11.9 (H) 4.0 - 10.5 K/uL   RBC 4.15 3.87 - 5.11 MIL/uL   Hemoglobin 11.4 (L) 12.0 - 15.0 g/dL   HCT 08/11/19 (L) 36 - 46 %   MCV 82.2 80.0 - 100.0 fL   MCH 27.5 26.0 - 34.0 pg   MCHC 33.4 30.0 - 36.0 g/dL   RDW 09.7 35.3 - 29.9 %   Platelets  370 150 - 400 K/uL   nRBC 0.0 0.0 - 0.2 %  Magnesium     Status: None   Collection Time: 08/09/19  4:12 AM  Result Value Ref Range   Magnesium 1.7 1.7 - 2.4 mg/dL  Glucose, capillary     Status: Abnormal   Collection Time: 08/09/19  7:46 AM  Result Value Ref Range   Glucose-Capillary 208 (H) 70 - 99 mg/dL  Glucose, capillary     Status: Abnormal   Collection Time: 08/09/19 11:36 AM  Result Value Ref Range   Glucose-Capillary 238 (H) 70 - 99 mg/dL  Glucose, capillary     Status: Abnormal   Collection Time: 08/09/19  4:47 PM  Result Value Ref Range   Glucose-Capillary 184 (H) 70 - 99 mg/dL    No results found.  Assessment/Plan:     Principal Problem:   Sepsis (HCC) Active Problems:  Infection of left hand   Hyperglycemia due to type 2 diabetes mellitus (HCC)  Continue IV antibiotics.  I discussed this case with Dr. Rivka Safer from infectious disease.  Patient has no focal abscess to be drained and there is no cortical destruction despite some edema in the distal phalanx.  I do not recommend any surgical intervention at this time.  Patient will continue IV antibiotics and then will be transitioned home once an separable oral alternative is recommended.  Patient may follow-up within 7 to 10 days in our office to see Dr. Stephenie Acres, our hand specialist at [facility] for further evaluation and management.   Juanell Fairly , MD 08/09/2019, 6:14 PM

## 2019-08-09 NOTE — Progress Notes (Signed)
PROGRESS NOTE    RICCI PAFF  ZMO:[id number] DOB: 1980/08/20 DOA: 08/07/2019 PCP: [facility]    Assessment & Plan:   Principal Problem:   Sepsis (HCC) Active Problems:   Infection of left hand   Hyperglycemia due to type 2 diabetes mellitus (HCC)    BENNY HENRIE is a 39 y.o. female with medical history significant for diabetes, with infection of the distal left thumb for which she has had repeated visits to the emergency room with drainage of paronychia followed by drainage for possible felon, on outpatient antibiotic treatment who returns to the emergency room because of continued pain at the site of prior drainage, now extending to the palmar surface of the hand.  She denies fever or chills.    Sepsis (HCC) Early Osteomyelitis of left thumb -tachycardia, leukocytosis, elevated lactic acid.  Patient presents with recurrent pain of the left thumb not responding to oral antibiotics and attempted drainage from the emergency room --MRI showed early osteo -Start IV Zosyn and vancomycin --ortho recommended abx treatment for now, no surgical intervention planned. PLAN: --continue IV vanc/zosyn for now --ID consult  --tramadol PRN for pain --follow-up within 7 to 10 days after discharge to see Dr. Stephenie Acres, hand specialist at [facility] for further evaluation and management.  Lactic acidosis, persistent --presumed due to infection, however, remained persistently between 2-3 despite IVF --d/c MIVF today --trend lactic acid    Hyperglycemia due to type 2 diabetes mellitus (HCC) --continue home Levemir 30u daily (down from 35) --ACHS and SSI TID --continue meal-time 7u TID  GERD --continue home PPI   Vaginal yeast infection --from abx, per pt report --Diflucan 100 mg x 3days    DVT prophylaxis: Lovenox SQ Code Status: Full code  Family Communication: daughter updated at bedside today Status is: inpatient Dispo:   The patient is from:  home Anticipated d/c is to: home Anticipated d/c date is: 2-3 days Patient currently is not medically stable to d/c due to: on IV empiric abx for now, pending ID discharge abx plan   Subjective and Interval History:  Pt reported headache which she believed was due to Vicodin.  Pain med switched to tramadol.  ID consult today.   Objective: Vitals:   08/09/19 0745 08/09/19 0918 08/09/19 1141 08/09/19 1621  BP: 118/82 109/69 109/72 (!) 140/90  Pulse: 79 82 88 79  Resp: 18 16 16 18   Temp: 97.6 F (36.4 C) 98.1 F (36.7 C) 98.2 F (36.8 C) 97.9 F (36.6 C)  TempSrc: Oral Oral Oral Oral  SpO2: 98% 98% 99% 98%  Weight:      Height:        Intake/Output Summary (Last 24 hours) at 08/09/2019 1909 Last data filed at 08/08/2019 2056 Gross per 24 hour  Intake 240 ml  Output --  Net 240 ml   Filed Weights   08/06/19 1808 08/07/19 0847  Weight: (!) 104.3 kg (!) 108.6 kg    Examination:   Constitutional: NAD, AAOx3 HEENT: conjunctivae and lids normal, EOMI CV: RRR no M,R,G. Distal pulses +2.  No cyanosis.   RESP: CTA B/L, normal respiratory effort  GI: +BS, NTND Extremities: No effusions, edema, or tenderness in BLE MSK: left thumb no swelling, no erythema SKIN: warm, dry and intact Neuro: II - XII grossly intact.  Sensation intact   Data Reviewed: I have personally reviewed following labs and imaging studies  CBC: Recent Labs  Lab 08/06/19 1815 08/07/19 0343 08/08/19 0406 08/09/19 0412  WBC 16.0*  16.8* 13.0* 11.9*  NEUTROABS 9.7*  --   --   --   HGB 15.0 13.5 13.4 11.4*  HCT 43.5 39.2 37.8 34.1*  MCV 79.7* 79.8* 79.1* 82.2  PLT 502* 470* 417* 370   Basic Metabolic Panel: Recent Labs  Lab 08/06/19 1815 08/07/19 0343 08/08/19 0406 08/09/19 0412  NA 135  --  141 141  K 3.4*  --  3.5 3.9  CL 100  --  107 110  CO2 23  --  26 26  GLUCOSE 275*  --  196* 224*  BUN 11  --  14 12  CREATININE 0.63 0.66 0.63 0.57  CALCIUM 9.1  --  8.5* 8.1*  MG  --   --  1.2*  1.7   GFR: Estimated Creatinine Clearance: 120.1 mL/min (by C-G formula based on SCr of 0.57 mg/dL). Liver Function Tests: Recent Labs  Lab 08/06/19 1815  AST 17  ALT 17  ALKPHOS 105  BILITOT 0.6  PROT 7.3  ALBUMIN 3.5   No results for input(s): LIPASE, AMYLASE in the last 168 hours. No results for input(s): AMMONIA in the last 168 hours. Coagulation Profile: Recent Labs  Lab 08/07/19 0343  INR 1.0   Cardiac Enzymes: No results for input(s): CKTOTAL, CKMB, CKMBINDEX, TROPONINI in the last 168 hours. BNP (last 3 results) No results for input(s): PROBNP in the last 8760 hours. HbA1C: Recent Labs    08/07/19 0343  HGBA1C 10.6*   CBG: Recent Labs  Lab 08/08/19 1631 08/08/19 2111 08/09/19 0746 08/09/19 1136 08/09/19 1647  GLUCAP 273* 249* 208* 238* 184*   Lipid Profile: No results for input(s): CHOL, HDL, LDLCALC, TRIG, CHOLHDL, LDLDIRECT in the last 72 hours. Thyroid Function Tests: No results for input(s): TSH, T4TOTAL, FREET4, T3FREE, THYROIDAB in the last 72 hours. Anemia Panel: No results for input(s): VITAMINB12, FOLATE, FERRITIN, TIBC, IRON, RETICCTPCT in the last 72 hours. Sepsis Labs: Recent Labs  Lab 08/06/19 2008 08/07/19 0233 08/07/19 0343 08/07/19 1112 08/08/19 0959  PROCALCITON  --  <0.10 <0.10  --   --   LATICACIDVEN 2.6*  --  2.7* 2.5* 2.5*    Recent Results (from the past 240 hour(s))  Blood Culture (routine x 2)     Status: None (Preliminary result)   Collection Time: 08/07/19  2:34 AM   Specimen: BLOOD  Result Value Ref Range Status   Specimen Description BLOOD BLOOD RIGHT HAND  Final   Special Requests   Final    BOTTLES DRAWN AEROBIC AND ANAEROBIC Blood Culture adequate volume   Culture   Final    NO GROWTH 2 DAYS Performed at [facility], [address], [place], [place] [address]    Report Status PENDING  Incomplete  Blood Culture (routine x 2)     Status: None (Preliminary result)   Collection Time: 08/07/19   2:34 AM   Specimen: BLOOD  Result Value Ref Range Status   Specimen Description BLOOD RIGHT ANTECUBITAL  Final   Special Requests   Final    BOTTLES DRAWN AEROBIC AND ANAEROBIC Blood Culture adequate volume   Culture   Final    NO GROWTH 2 DAYS Performed at [facility], [address], [place], [place] [address]    Report Status PENDING  Incomplete  SARS Coronavirus 2 by RT PCR (hospital order, performed in [facility] hospital lab) Nasopharyngeal Nasopharyngeal Swab     Status: None   Collection Time: 08/07/19  3:06 AM   Specimen: Nasopharyngeal Swab  Result Value Ref Range Status   SARS Coronavirus 2 NEGATIVE NEGATIVE Final    Comment: (NOTE) SARS-CoV-2 target nucleic acids are NOT DETECTED.  The SARS-CoV-2 RNA is generally detectable in upper and lower respiratory specimens during the acute phase of infection. The lowest concentration of SARS-CoV-2 viral copies this assay can detect is 250 copies / mL. A negative result does not preclude SARS-CoV-2 infection and should not be used as the sole basis for treatment or other patient management decisions.  A negative result may occur with improper specimen collection / handling, submission of specimen other than nasopharyngeal swab, presence of viral mutation(s) within the areas targeted by this assay, and inadequate number of viral copies (<250 copies / mL). A negative result must be combined with clinical observations, patient history, and epidemiological information.  Fact Sheet for Patients:   BoilerBrush.com.cy  Fact Sheet for Healthcare Providers: https://pope.com/  This test is not yet approved or  cleared by the [place] FDA and has been authorized for detection and/or diagnosis of SARS-CoV-2 by FDA under an Emergency Use Authorization (EUA).  This EUA will remain in effect (meaning this test can be used) for the duration of the COVID-19 declaration under  Section 564(b)(1) of the Act, 21 U.S.C. section 360bbb-3(b)(1), unless the authorization is terminated or revoked sooner.  Performed at [facility], [address] [place], [place] [address]   Urine culture     Status: Abnormal   Collection Time: 08/07/19  5:21 AM   Specimen: In/Out Cath Urine  Result Value Ref Range Status   Specimen Description   Final    IN/OUT CATH URINE Performed at [facility], [address] [place], [place] [address]    Special Requests   Final    NONE Performed at [facility], [address] [place], [place] [address]    Culture (A)  Final    <10,000 COLONIES/mL INSIGNIFICANT GROWTH Performed at [facility], [address], [place], [place] [address]    Report Status 08/08/2019 FINAL  Final      Radiology Studies: No results found.   Scheduled Meds:  enoxaparin (LOVENOX) injection  40 mg Subcutaneous Q24H   fluconazole  100 mg Oral Daily   insulin aspart  0-15 Units Subcutaneous TID WC   insulin aspart  7 Units Subcutaneous TID WC   insulin detemir  30 Units Subcutaneous Daily   pantoprazole  40 mg Oral Daily   Continuous Infusions:  piperacillin-tazobactam (ZOSYN)  IV 3.375 g (08/09/19 1018)   vancomycin 1,000 mg (08/09/19 0748)     LOS: 2 days     Darlin Priestly, MD [facility] If 7PM-7AM, please contact night-coverage 08/09/2019, 7:09 PM

## 2019-08-09 NOTE — Consult Note (Signed)
NAME: Kendra Perkins  DOB: [birth year]  MRN: [id number]  Date/Time: 08/09/2019 3:35 PM  REQUESTING PROVIDER: Dr. Fran Lowes Subjective:  REASON FOR CONSULT: Left thumb infection ? Kendra Perkins is a 39 y.o. female with a history of diabetes mellitus poorly controlled, asthma, chronic pain syndrome, chronic bilateral low back pain without sciatica, lumbar facet arthropathy is followed at [facility] pain management clinic presents to the hospital with left thumb pain and infection. As per patient she has had infection of the left thumbnail for 3 weeks now.  She does not remember any Initiating factor before this all started.  On further questioning the only thing she had done was  a manicure by herself where she pushed the cuticle of the thumb. She came to the ED on 07/30/2019 complaining of left thumb pain but left without being seen.  She came back the next day and was seen by the nurse practitioner who diagnosed acute paronychia of left thumb and had done an IND but did not send any cultures.  She was sent home on Keflex 500 mg 3 times a day along with fluconazole for a week.  She was also given hydrocodone acetaminophen.  She returned to the ED again the same night and had another surgical I&D and was sent home on p.o. doxycycline along with Keflex.  She then returned on 08/06/2019 with continuing pain in spite of 3 different antibiotics.  She did not have any fever or chills. She underwent an MRI of the thumb which showed enhancement throughout the soft tissue of the distal thumb including the subungual region consistent with soft tissue infection.  There was no focal fluid collection or foreign body identified. There was enhancement within the distal phalanx of the thumb without cortical destruction suspicious for early osteo-.  There was no evidence of septic arthritis or other significant osseous findings. She was put on vancomycin and Zosyn.  She has been seen by orthopedics and they have advised  antibiotics.  I am asked to see the patient for the same. Patient is comfortable with no distress. Patient says she has 3 cats.  No bites.  Has had some scratches. Past Medical History:  Diagnosis Date   Arthritis    lower back   Asthma    rarely uses inhaler   Back pain 2000   Depression    no meds   Diabetes mellitus without complication (HCC)    borderline - no meds   GERD (gastroesophageal reflux disease)    occasional - diet controlled    Past Surgical History:  Procedure Laterality Date   APPENDECTOMY     ?. pt thinks she had it removed (laparoscopically)   CESAREAN SECTION  2002, 2003   x 2   DILATION AND EVACUATION N/A 11/08/2015   Procedure: DILATATION AND EVACUATION;  Surgeon: Tereso Newcomer, MD;  Location: [facility];  Service: Gynecology;  Laterality: N/A;   WISDOM TOOTH EXTRACTION      Social History   Socioeconomic History   Marital status: Married    Spouse name: Not on file   Number of children: Not on file   Years of education: Not on file   Highest education level: Not on file  Occupational History   Not on file  Tobacco Use   Smoking status: Current Every Day Smoker    Packs/day: 1.00    Years: 15.00    Pack years: 15.00    Types: Cigarettes   Smokeless tobacco: Never Used  Substance and  Sexual Activity   Alcohol use: No   Drug use: No   Sexual activity: Yes    Birth control/protection: None    Comment: 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  Other Topics Concern   Not on file  Social History Narrative   Not on file   Social Determinants of Health   Financial Resource Strain:    Difficulty of Paying Living Expenses:   Food Insecurity:    Worried About Programme researcher, broadcasting/film/video in the Last Year:    Barista in the Last Year:   Transportation Needs:    Freight forwarder (Medical):    Lack of Transportation (Non-Medical):   Physical Activity:    Days of Exercise per Week:    Minutes of Exercise per Session:     Stress:    Feeling of Stress :   Social Connections:    Frequency of Communication with Friends and Family:    Frequency of Social Gatherings with Friends and Family:    Attends Religious Services:    Active Member of Clubs or Organizations:    Attends Engineer, structural:    Marital Status:   Intimate Partner Violence:    Fear of Current or Ex-Partner:    Emotionally Abused:    Physically Abused:    Sexually Abused:     Family History  Problem Relation Age of Onset   Cancer Mother    Diabetes Father    Alcohol abuse Father    Multiple sclerosis Daughter    Allergies  Allergen Reactions   Other Itching and Other (See Comments)    Cat dander = Sneezing     ? Current Facility-Administered Medications  Medication Dose Route Frequency Provider Last Rate Last Admin   acetaminophen (TYLENOL) tablet 650 mg  650 mg Oral Q6H PRN Andris Baumann, MD   650 mg at 08/09/19 0757   Or   acetaminophen (TYLENOL) suppository 650 mg  650 mg Rectal Q6H PRN Andris Baumann, MD       enoxaparin (LOVENOX) injection 40 mg  40 mg Subcutaneous Q24H Lindajo Royal V, MD   40 mg at 08/09/19 0745   fluconazole (DIFLUCAN) tablet 100 mg  100 mg Oral Daily Darlin Priestly, MD   100 mg at 08/09/19 1016   insulin aspart (novoLOG) injection 0-15 Units  0-15 Units Subcutaneous TID WC Lindajo Royal V, MD   5 Units at 08/09/19 1234   insulin aspart (novoLOG) injection 7 Units  7 Units Subcutaneous TID WC Darlin Priestly, MD   7 Units at 08/09/19 1234   insulin detemir (LEVEMIR) injection 30 Units  30 Units Subcutaneous Daily Darlin Priestly, MD   30 Units at 08/09/19 0800   ketorolac (TORADOL) 30 MG/ML injection 30 mg  30 mg Intravenous Q6H PRN Andris Baumann, MD   30 mg at 08/07/19 2009   ondansetron (ZOFRAN) tablet 4 mg  4 mg Oral Q6H PRN Andris Baumann, MD       Or   ondansetron [facility]) injection 4 mg  4 mg Intravenous Q6H PRN Andris Baumann, MD       pantoprazole (PROTONIX) EC tablet  40 mg  40 mg Oral Daily Darlin Priestly, MD   40 mg at 08/09/19 0745   piperacillin-tazobactam (ZOSYN) IVPB 3.375 g  3.375 g Intravenous Q8H Hall, Scott A, RPH 12.5 mL/hr at 08/09/19 1018 3.375 g at 08/09/19 1018   traMADol (ULTRAM) tablet 50-100 mg  50-100 mg Oral Q6H PRN Fran Lowes,  Inetta Fermoina, MD   100 mg at 08/09/19 1339   vancomycin (VANCOCIN) IVPB 1000 mg/200 mL premix  1,000 mg Intravenous Q12H Valrie HartHall, Scott A, RPH 200 mL/hr at 08/09/19 0748 1,000 mg at 08/09/19 0748     Abtx:  Anti-infectives (From admission, onward)   Start     Dose/Rate Route Frequency Ordered Stop   08/08/19 1000  fluconazole (DIFLUCAN) tablet 100 mg     Discontinue     100 mg Oral Daily 08/08/19 0825 08/11/19 0959   08/07/19 1100  piperacillin-tazobactam (ZOSYN) IVPB 3.375 g     Discontinue     3.375 g 12.5 mL/hr over 240 Minutes Intravenous Every 8 hours 08/07/19 0336     08/07/19 0800  vancomycin (VANCOCIN) IVPB 1000 mg/200 mL premix     Discontinue     1,000 mg 200 mL/hr over 60 Minutes Intravenous Every 12 hours 08/07/19 0341     08/07/19 0230  vancomycin (VANCOCIN) IVPB 1000 mg/200 mL premix        1,000 mg 200 mL/hr over 60 Minutes Intravenous  Once 08/07/19 0227 08/07/19 0405   08/07/19 0230  piperacillin-tazobactam (ZOSYN) IVPB 3.375 g        3.375 g 100 mL/hr over 30 Minutes Intravenous  Once 08/07/19 0227 08/07/19 0310      REVIEW OF SYSTEMS:  Const: negative fever, negative chills, negative weight loss Eyes: negative diplopia or visual changes, negative eye pain ENT: negative coryza, negative sore throat Resp: negative cough, hemoptysis, dyspnea Cards: negative for chest pain, palpitations, lower extremity edema GU: negative for frequency, dysuria and hematuria GI: Negative for abdominal pain, diarrhea, bleeding, constipation Skin: negative for rash and pruritus Heme: negative for easy bruising and gum/nose bleeding MS: As above Neurolo:negative for headaches, dizziness, vertigo, memory problems  Psych:  negative for feelings of anxiety, depression  Endocrine- mellitus not taking any medications Allergy/Immunology- negative for any medication or food allergies  Objective:  VITALS:  BP 109/72 (BP Location: Right Arm)    Pulse 88    Temp 98.2 F (36.8 C) (Oral)    Resp 16    Ht 5\' 6"  (1.676 m)    Wt (!) 108.6 kg    LMP 07/26/2019    SpO2 99%    BMI 38.64 kg/m  PHYSICAL EXAM:  General: Alert, cooperative, no distress, appears stated age.  Head: Normocephalic, without obvious abnormality, atraumatic. Eyes: Conjunctivae clear, anicteric sclerae. Pupils are equal ENT Nares normal. No drainage or sinus tenderness. Lips, mucosa, and tongue normal. No Thrush Neck: Supple, symmetrical, no adenopathy, thyroid: non tender no carotid bruit and no JVD. Back: No CVA tenderness. Lungs: Clear to auscultation bilaterally. No Wheezing or Rhonchi. No rales. Heart: Regular rate and rhythm, no murmur, rub or gallop. Abdomen: Soft, non-tender,not distended. Bowel sounds normal. No masses Extremities: Left thumb no erythema.  There is an vertical incision on the palmar aspect of it.  Around the nail fold there is some discoloration        skin: No rashes or lesions. Or bruising Lymph: Cervical, supraclavicular normal. Neurologic: Grossly non-focal Pertinent Labs Lab Results CBC    Component Value Date/Time   WBC 11.9 (H) 08/09/2019 0412   RBC 4.15 08/09/2019 0412   HGB 11.4 (L) 08/09/2019 0412   HGB 13.4 12/12/2011 2049   HCT 34.1 (L) 08/09/2019 0412   HCT 39.3 12/12/2011 2049   PLT 370 08/09/2019 0412   PLT 442 (H) 12/12/2011 2049   MCV 82.2 08/09/2019 0412   MCV 80  12/12/2011 2049   MCH 27.5 08/09/2019 0412   MCHC 33.4 08/09/2019 0412   RDW 13.0 08/09/2019 0412   RDW 14.2 12/12/2011 2049   LYMPHSABS 5.1 (H) 08/06/2019 1815   LYMPHSABS 6.0 (H) 12/12/2011 2049   MONOABS 0.7 08/06/2019 1815   MONOABS 0.8 12/12/2011 2049   EOSABS 0.3 08/06/2019 1815   EOSABS 0.4 12/12/2011 2049    BASOSABS 0.1 08/06/2019 1815   BASOSABS 0.0 12/12/2011 2049    CMP Latest Ref Rng & Units 08/09/2019 08/08/2019 08/07/2019  Glucose 70 - 99 mg/dL 254(Y) 706(C) -  BUN 6 - 20 mg/dL 12 14 -  Creatinine 3.76 - 1.00 mg/dL 2.83 1.51 7.61  Sodium 135 - 145 mmol/L 141 141 -  Potassium 3.5 - 5.1 mmol/L 3.9 3.5 -  Chloride 98 - 111 mmol/L 110 107 -  CO2 22 - 32 mmol/L 26 26 -  Calcium 8.9 - 10.3 mg/dL 8.1(L) 8.5(L) -  Total Protein 6.5 - 8.1 g/dL - - -  Total Bilirubin 0.3 - 1.2 mg/dL - - -  Alkaline Phos 38 - 126 U/L - - -  AST 15 - 41 U/L - - -  ALT 0 - 44 U/L - - -      Microbiology: Recent Results (from the past 240 hour(s))  Blood Culture (routine x 2)     Status: None (Preliminary result)   Collection Time: 08/07/19  2:34 AM   Specimen: BLOOD  Result Value Ref Range Status   Specimen Description BLOOD BLOOD RIGHT HAND  Final   Special Requests   Final    BOTTLES DRAWN AEROBIC AND ANAEROBIC Blood Culture adequate volume   Culture   Final    NO GROWTH 2 DAYS Performed at [facility], [address] [place], [place] [address]    Report Status PENDING  Incomplete  Blood Culture (routine x 2)     Status: None (Preliminary result)   Collection Time: 08/07/19  2:34 AM   Specimen: BLOOD  Result Value Ref Range Status   Specimen Description BLOOD RIGHT ANTECUBITAL  Final   Special Requests   Final    BOTTLES DRAWN AEROBIC AND ANAEROBIC Blood Culture adequate volume   Culture   Final    NO GROWTH 2 DAYS Performed at [facility], [address], [place], [place] [address]    Report Status PENDING  Incomplete  SARS Coronavirus 2 by RT PCR (hospital order, performed in [facility] hospital lab) Nasopharyngeal Nasopharyngeal Swab     Status: None   Collection Time: 08/07/19  3:06 AM   Specimen: Nasopharyngeal Swab  Result Value Ref Range Status   SARS Coronavirus 2 NEGATIVE NEGATIVE Final    Comment: (NOTE) SARS-CoV-2 target nucleic acids are NOT  DETECTED.  The SARS-CoV-2 RNA is generally detectable in upper and lower respiratory specimens during the acute phase of infection. The lowest concentration of SARS-CoV-2 viral copies this assay can detect is 250 copies / mL. A negative result does not preclude SARS-CoV-2 infection and should not be used as the sole basis for treatment or other patient management decisions.  A negative result may occur with improper specimen collection / handling, submission of specimen other than nasopharyngeal swab, presence of viral mutation(s) within the areas targeted by this assay, and inadequate number of viral copies (<250 copies / mL). A negative result must be combined with clinical observations, patient history, and epidemiological information.  Fact Sheet for Patients:   BoilerBrush.com.cy  Fact Sheet for Healthcare Providers:  https://pope.com/  This test is not yet approved or  cleared by the [place] and has been authorized for detection and/or diagnosis of SARS-CoV-2 by FDA under an Emergency Use Authorization (EUA).  This EUA will remain in effect (meaning this test can be used) for the duration of the COVID-19 declaration under Section 564(b)(1) of the Act, 21 U.S.C. section 360bbb-3(b)(1), unless the authorization is terminated or revoked sooner.  Performed at [facility], [address] [place], [place] [address]   Urine culture     Status: Abnormal   Collection Time: 08/07/19  5:21 AM   Specimen: In/Out Cath Urine  Result Value Ref Range Status   Specimen Description   Final    IN/OUT CATH URINE Performed at [facility], [address] [place], [place] [address]    Special Requests   Final    NONE Performed at [facility], [address] [place], [place] [address]    Culture (A)  Final    <10,000 COLONIES/mL INSIGNIFICANT GROWTH Performed at [facility], [address], [place], [place] [address]    Report Status 08/08/2019 FINAL  Final    IMAGING RESULTS:  I have personally reviewed the films Diffuse T2 hyperintensity and enhancement throughout the soft tissues of the distal thumb, including this subungual region, consistent with soft tissue infection. No focal fluid collection or foreign body identified. 2. Prominent marrow T2 hyperintensity and enhancement within the distal phalanx of the thumb without cortical destruction, suspicious for early osteomyelitis. 3. No evidence of septic joint or other significant osseous Findings.   ?Negative for fracture or dislocation. 2. Question mild irregularity involving the thumb tuft. Subtle cortical irregularity in this area cannot be excluded. Please orrelate this radiographic finding with the area of concern on physical examination.  Impression/Recommendation ?Left thumb paronychia with  soft tissue infection status post I&D on 07/31/2019.Marland Kitchen  Clinically this does not look like osteomyelitis or septic arthritis.  As there are no cultures sent and currently no surgical intervention  needed, we will have to treat her with IV antibiotics for a few days or so and then switch her to an oral antibiotic. Currently on vancomycin and Zosyn and leukocytosis has resolved. She will have to follow-up with a hand surgeon as outpatient. Discussed with Dr. Martha Clan.  Diabetes mellitus was not taking any medication.  Hemoglobin A1c is 10.6. ? ?There is a low cortisol level documented of 2.4.  Need to recheck cortisol as well as check ACTH.  Will discuss with the hospitalist . ___________________________________________________ Discussed with patient, requesting provider Note:  This document was prepared using Dragon voice recognition software and may include unintentional dictation errors.

## 2019-08-09 NOTE — Progress Notes (Signed)
Inpatient Diabetes Program Recommendations  AACE/ADA: New Consensus Statement on Inpatient Glycemic Control   Target Ranges:  Prepandial:   less than 140 mg/dL      Peak postprandial:   less than 180 mg/dL (1-2 hours)      Critically ill patients:  140 - 180 mg/dL   Results for Kendra Perkins, Kendra Perkins (MRN [id number]) as of 08/09/2019 11:59  Ref. Range 08/08/2019 08:00 08/08/2019 11:32 08/08/2019 16:31 08/08/2019 21:11 08/09/2019 07:46 08/09/2019 11:36  Glucose-Capillary Latest Ref Range: 70 - 99 mg/dL 973 (H) 532 (H) 992 (H) 249 (H) 208 (H) 238 (H)  Results for Kendra Perkins, Kendra Perkins (MRN [id number]) as of 08/09/2019 11:59  Ref. Range 08/07/2019 03:43  Hemoglobin A1C Latest Ref Range: 4.8 - 5.6 % 10.6 (H)   Review of Glycemic Control  Diabetes history: DM2 Outpatient Diabetes medications: Levemir 35 units daily Current orders for Inpatient glycemic control: Levemir 30 units daily (was 35 units daily), Novolog 7 units TID with meals (started today), Novolog 0-15 units TID with meals  Inpatient Diabetes Program Recommendations:    Insulin-Noted Levemir was decreased from 35 to 30 units and meal coverage insulin was added. Glucose today 208 mg/dl and 222 mg/dl. Please consider increasing Levemir back up to 35 units daily.  HbgA1C: A1C 10.6% on 08/07/19 indicating an average glucose of 258 mg/dl over the past 2-3 months. Patient has not been consistently checking glucose at home and requested prescription for glucose test strips at d/c. Patient is also open to outpatient DM medication adjustments to help improve outpatient DM control.  NOTE: Spoke with patient about diabetes and home regimen for diabetes control. Patient reports being followed by [facility] for diabetes management and currently taking Levemir 35 units daily as an outpatient for diabetes control. Patient reports that when she was started on Levemir at [facility] she was told to start with 10 units and increase it by 2 units if CBGs  elevated. Patient admits that she has not been checking glucose at home and she notes that she will need a prescription for glucose test strips (will ask MD to prescribe at d/c).  Patient also states she knows she hasn't been doing right with DM management but she states she plans to get back on track.  Patient does not recall her last A1C value.  Discussed A1C results (10.6% on 08/07/19 ) and explained that current A1C indicates an average glucose of 258 mg/dl over the past 2-3 months. Discussed glucose and A1C goals. Discussed importance of checking CBGs and maintaining good CBG control to prevent long-term and short-term complications. Explained how hyperglycemia leads to damage within blood vessels which lead to the common complications seen with uncontrolled diabetes.  Also discussed how hyperglycemia can impact infection and wound healing. Stressed to the patient the importance of improving glycemic control to prevent further complications from uncontrolled diabetes. Patient states that prior to starting Levemir she was taking Metformin which was stopped when Levemir was started. Patient is open to outpatient DM medication adjustments to get DM under better control. Encouraged patient to check glucose 3-4 times per day (before meals and at bedtime) and to keep a log book of glucose readings and DM medication taken which patient will need to take to doctor appointments. Patient states that she has an app on her phone that she can record everything in.  Patient notes that she feels like insulin makes her feel hungry all the time and she gains weight with insulin. Discussed asking PCP about using  other DM medications that could help with DM control and weight loss (such as GLP-1 medications if appropriate).  Patient appreciative of information discussed and states that she plans to do better about taking care of her DM. Patient verbalized understanding of information discussed and reports no further questions at  this time related to diabetes.  Thanks, Orlando Penner, RN, MSN, CDE Diabetes Coordinator Inpatient Diabetes Program [phone] (Team Pager)

## 2019-08-10 LAB — CBC
HCT: 35.2 % — ABNORMAL LOW (ref 36.0–46.0)
Hemoglobin: 11.8 g/dL — ABNORMAL LOW (ref 12.0–15.0)
MCH: 27.6 pg (ref 26.0–34.0)
MCHC: 33.5 g/dL (ref 30.0–36.0)
MCV: 82.2 fL (ref 80.0–100.0)
Platelets: 378 10*3/uL (ref 150–400)
RBC: 4.28 MIL/uL (ref 3.87–5.11)
RDW: 12.8 % (ref 11.5–15.5)
WBC: 14.3 10*3/uL — ABNORMAL HIGH (ref 4.0–10.5)
nRBC: 0 % (ref 0.0–0.2)

## 2019-08-10 LAB — BASIC METABOLIC PANEL
Anion gap: 8 (ref 5–15)
BUN: 13 mg/dL (ref 6–20)
CO2: 24 mmol/L (ref 22–32)
Calcium: 8.4 mg/dL — ABNORMAL LOW (ref 8.9–10.3)
Chloride: 105 mmol/L (ref 98–111)
Creatinine, Ser: 0.6 mg/dL (ref 0.44–1.00)
GFR calc Af Amer: >60 mL/min (ref >60)
GFR calc non Af Amer: >60 mL/min (ref >60)
Glucose, Bld: 226 mg/dL — ABNORMAL HIGH (ref 70–99)
Potassium: 4 mmol/L (ref 3.5–5.1)
Sodium: 137 mmol/L (ref 135–145)

## 2019-08-10 LAB — GLUCOSE, CAPILLARY
Glucose-Capillary: 168 mg/dL — ABNORMAL HIGH (ref 70–99)
Glucose-Capillary: 185 mg/dL — ABNORMAL HIGH (ref 70–99)
Glucose-Capillary: 199 mg/dL — ABNORMAL HIGH (ref 70–99)
Glucose-Capillary: 178 mg/dL — ABNORMAL HIGH (ref 70–99)

## 2019-08-10 LAB — SEDIMENTATION RATE: Sed Rate: 16 mm/hr (ref 0–20)

## 2019-08-10 LAB — C-REACTIVE PROTEIN: CRP: 1.7 mg/dL — ABNORMAL HIGH (ref <1.0)

## 2019-08-10 LAB — MAGNESIUM: Magnesium: 1.6 mg/dL — ABNORMAL LOW (ref 1.7–2.4)

## 2019-08-10 MED ORDER — ALBUTEROL SULFATE (2.5 MG/3ML) 0.083% IN NEBU
2.5000 mg | INHALATION_SOLUTION | Freq: Four times a day (QID) | RESPIRATORY_TRACT | Status: DC | PRN
  Administered 2019-08-10: 2.5 mg via RESPIRATORY_TRACT
  Filled 2019-08-10: qty 3

## 2019-08-10 MED ORDER — VANCOMYCIN HCL IN DEXTROSE 1-5 GM/200ML-% IV SOLN
1000.0000 mg | Freq: Three times a day (TID) | INTRAVENOUS | Status: DC
  Administered 2019-08-10 – 2019-08-11 (×2): 1000 mg via INTRAVENOUS
  Filled 2019-08-10 (×5): qty 200

## 2019-08-10 MED ORDER — MAGNESIUM SULFATE 2 GM/50ML IV SOLN
2.0000 g | Freq: Once | INTRAVENOUS | Status: AC
  Administered 2019-08-10: 2 g via INTRAVENOUS
  Filled 2019-08-10: qty 50

## 2019-08-10 NOTE — Progress Notes (Signed)
Subjective:  Patient still complains of pain over the left distal phalanx of the thumb.  Objective:   VITALS:   Vitals:   08/09/19 1141 08/09/19 1621 08/09/19 2224 08/10/19 0806  BP: 109/72 (!) 140/90 (!) 137/89 (!) 135/90  Pulse: 88 79 78 82  Resp: 16 18 14 16   Temp: 98.2 F (36.8 C) 97.9 F (36.6 C) 98 F (36.7 C) 98 F (36.7 C)  TempSrc: Oral Oral Oral Oral  SpO2: 99% 98% 99% 99%  Weight:      Height:        PHYSICAL EXAM: Left thumb: Patient can move the IP joint without pain.  Her incisions remain clean dry and intact.  Patient has mild swelling of the distal phalanx with tenderness.  She has intact sensation to light touch in her thumb is well-perfused.   LABS  Results for orders placed or performed during the hospital encounter of 08/07/19 (from the past 24 hour(s))  Glucose, capillary     Status: Abnormal   Collection Time: 08/09/19  4:47 PM  Result Value Ref Range   Glucose-Capillary 184 (H) 70 - 99 mg/dL  Glucose, capillary     Status: Abnormal   Collection Time: 08/09/19  9:02 PM  Result Value Ref Range   Glucose-Capillary 197 (H) 70 - 99 mg/dL   Comment 1 Notify RN   Basic metabolic panel     Status: Abnormal   Collection Time: 08/10/19  5:38 AM  Result Value Ref Range   Sodium 137 135 - 145 mmol/L   Potassium 4.0 3.5 - 5.1 mmol/L   Chloride 105 98 - 111 mmol/L   CO2 24 22 - 32 mmol/L   Glucose, Bld 226 (H) 70 - 99 mg/dL   BUN 13 6 - 20 mg/dL   Creatinine, Ser 08/12/19 0.44 - 1.00 mg/dL   Calcium 8.4 (L) 8.9 - 10.3 mg/dL   GFR calc non Af Amer >60 >60 mL/min   GFR calc Af Amer >60 >60 mL/min   Anion gap 8 5 - 15  CBC     Status: Abnormal   Collection Time: 08/10/19  5:38 AM  Result Value Ref Range   WBC 14.3 (H) 4.0 - 10.5 K/uL   RBC 4.28 3.87 - 5.11 MIL/uL   Hemoglobin 11.8 (L) 12.0 - 15.0 g/dL   HCT 08/12/19 (L) 36 - 46 %   MCV 82.2 80.0 - 100.0 fL   MCH 27.6 26.0 - 34.0 pg   MCHC 33.5 30.0 - 36.0 g/dL   RDW 38.1 82.9 - 93.7 %   Platelets 378  150 - 400 K/uL   nRBC 0.0 0.0 - 0.2 %  Magnesium     Status: Abnormal   Collection Time: 08/10/19  5:38 AM  Result Value Ref Range   Magnesium 1.6 (L) 1.7 - 2.4 mg/dL  Sedimentation rate     Status: None   Collection Time: 08/10/19  5:38 AM  Result Value Ref Range   Sed Rate 16 0 - 20 mm/hr  C-reactive protein     Status: Abnormal   Collection Time: 08/10/19  5:38 AM  Result Value Ref Range   CRP 1.7 (H) <1.0 mg/dL  Glucose, capillary     Status: Abnormal   Collection Time: 08/10/19  8:08 AM  Result Value Ref Range   Glucose-Capillary 168 (H) 70 - 99 mg/dL  Glucose, capillary     Status: Abnormal   Collection Time: 08/10/19 11:59 AM  Result Value Ref Range  Glucose-Capillary 185 (H) 70 - 99 mg/dL    No results found.  Assessment/Plan:     Principal Problem:   Sepsis (HCC) Active Problems:   Infection of left hand   Hyperglycemia due to type 2 diabetes mellitus (HCC)  Continue IV antibiotics until an appropriate p.o. alternative is determined by infectious disease.  Patient may follow-up in the orthopedic office within 7 to 10 days to see Dr. Stephenie Acres, the hand specialist.  No surgery is not likely and certainly will not be required during this hospitalization.  I suspect the patient will have resolution of her symptoms with antibiotic treatment.    Juanell Fairly , MD 08/10/2019, 4:02 PM

## 2019-08-10 NOTE — Progress Notes (Signed)
Patient had IV earlier removed d/t infiltrated LFA, Then when she woke up from nap the IV to RFA infiltrated. Removed and applied warm compress

## 2019-08-10 NOTE — Progress Notes (Signed)
PROGRESS NOTE    Kendra Perkins  GYJ:[id number] DOB: 1980-08-10 DOA: 08/07/2019 PCP: [facility], [facility]    Assessment & Plan:   Principal Problem:   Sepsis (HCC) Active Problems:   Infection of left hand   Hyperglycemia due to type 2 diabetes mellitus (HCC)    Kendra Perkins is a 39 y.o. female with medical history significant for diabetes, with infection of the distal left thumb for which she has had repeated visits to the emergency room with drainage of paronychia followed by drainage for possible felon, on outpatient antibiotic treatment who returns to the emergency room because of continued pain at the site of prior drainage, now extending to the palmar surface of the hand.  She denies fever or chills.    Sepsis (HCC) Early Osteomyelitis of left thumb -tachycardia, leukocytosis, elevated lactic acid.  Patient presents with recurrent pain of the left thumb not responding to oral antibiotics and attempted drainage from the emergency room --MRI showed early osteo -Start IV Zosyn and vancomycin --ortho recommended abx treatment for now, no surgical intervention planned. --ID consulted PLAN: --continue IV vanc/zosyn for now, per ID --tramadol PRN for pain --follow-up within 7 to 10 days after discharge to see Dr. Stephenie Acres, hand specialist at [facility] for further evaluation and management.  Lactic acidosis, persistent --presumed due to infection, however, remained persistently between 2-3 despite IVF --d/c MIVF today --trend lactic acid    Hyperglycemia due to type 2 diabetes mellitus (HCC) --continue home Levemir 30u daily (down from 35) --ACHS and SSI TID --continue meal-time 7u TID  GERD --continue home PPI   Vaginal yeast infection --from abx, per pt report --Diflucan 100 mg x 3days    DVT prophylaxis: Lovenox SQ Code Status: Full code  Family Communication: daughter updated at bedside today Status is: inpatient Dispo:   The patient is  from: home Anticipated d/c is to: home Anticipated d/c date is: 1-2 days Patient currently is not medically stable to d/c due to: on IV empiric abx for now, pending ID discharge abx plan   Subjective and Interval History:  Still complained of pain in her thumb.  No fever.    Objective: Vitals:   08/09/19 2224 08/10/19 0806 08/10/19 1633 08/10/19 1724  BP: (!) 137/89 (!) 135/90 (!) 137/83   Pulse: 78 82 80   Resp: 14 16 19    Temp: 98 F (36.7 C) 98 F (36.7 C) 97.6 F (36.4 C)   TempSrc: Oral Oral Oral   SpO2: 99% 99% 98% 98%  Weight:      Height:        Intake/Output Summary (Last 24 hours) at 08/10/2019 1741 Last data filed at 08/10/2019 1406 Gross per 24 hour  Intake 240 ml  Output --  Net 240 ml   Filed Weights   08/06/19 1808 08/07/19 0847  Weight: (!) 104.3 kg (!) 108.6 kg    Examination:   Constitutional: NAD, AAOx3 HEENT: conjunctivae and lids normal, EOMI CV: RRR no M,R,G. Distal pulses +2.  No cyanosis.   RESP: CTA B/L, normal respiratory effort  GI: +BS, NTND Extremities: No effusions, edema, or tenderness in BLE MSK: left thumb no swelling, no erythema SKIN: warm, dry and intact Neuro: II - XII grossly intact.  Sensation intact   Data Reviewed: I have personally reviewed following labs and imaging studies  CBC: Recent Labs  Lab 08/06/19 1815 08/07/19 0343 08/08/19 0406 08/09/19 0412 08/10/19 0538  WBC 16.0* 16.8* 13.0* 11.9* 14.3*  NEUTROABS 9.7*  --   --   --   --  HGB 15.0 13.5 13.4 11.4* 11.8*  HCT 43.5 39.2 37.8 34.1* 35.2*  MCV 79.7* 79.8* 79.1* 82.2 82.2  PLT 502* 470* 417* 370 378   Basic Metabolic Panel: Recent Labs  Lab 08/06/19 1815 08/07/19 0343 08/08/19 0406 08/09/19 0412 08/10/19 0538  NA 135  --  141 141 137  K 3.4*  --  3.5 3.9 4.0  CL 100  --  107 110 105  CO2 23  --  26 26 24   GLUCOSE 275*  --  196* 224* 226*  BUN 11  --  14 12 13   CREATININE 0.63 0.66 0.63 0.57 0.60  CALCIUM 9.1  --  8.5* 8.1* 8.4*  MG  --    --  1.2* 1.7 1.6*   GFR: Estimated Creatinine Clearance: 120.1 mL/min (by C-G formula based on SCr of 0.6 mg/dL). Liver Function Tests: Recent Labs  Lab 08/06/19 1815  AST 17  ALT 17  ALKPHOS 105  BILITOT 0.6  PROT 7.3  ALBUMIN 3.5   No results for input(s): LIPASE, AMYLASE in the last 168 hours. No results for input(s): AMMONIA in the last 168 hours. Coagulation Profile: Recent Labs  Lab 08/07/19 0343  INR 1.0   Cardiac Enzymes: No results for input(s): CKTOTAL, CKMB, CKMBINDEX, TROPONINI in the last 168 hours. BNP (last 3 results) No results for input(s): PROBNP in the last 8760 hours. HbA1C: No results for input(s): HGBA1C in the last 72 hours. CBG: Recent Labs  Lab 08/09/19 1647 08/09/19 2102 08/10/19 0808 08/10/19 1159 08/10/19 1630  GLUCAP 184* 197* 168* 185* 199*   Lipid Profile: No results for input(s): CHOL, HDL, LDLCALC, TRIG, CHOLHDL, LDLDIRECT in the last 72 hours. Thyroid Function Tests: No results for input(s): TSH, T4TOTAL, FREET4, T3FREE, THYROIDAB in the last 72 hours. Anemia Panel: No results for input(s): VITAMINB12, FOLATE, FERRITIN, TIBC, IRON, RETICCTPCT in the last 72 hours. Sepsis Labs: Recent Labs  Lab 08/06/19 2008 08/07/19 0233 08/07/19 0343 08/07/19 1112 08/08/19 0959  PROCALCITON  --  <0.10 <0.10  --   --   LATICACIDVEN 2.6*  --  2.7* 2.5* 2.5*    Recent Results (from the past 240 hour(s))  Blood Culture (routine x 2)     Status: None (Preliminary result)   Collection Time: 08/07/19  2:34 AM   Specimen: BLOOD  Result Value Ref Range Status   Specimen Description BLOOD BLOOD RIGHT HAND  Final   Special Requests   Final    BOTTLES DRAWN AEROBIC AND ANAEROBIC Blood Culture adequate volume   Culture   Final    NO GROWTH 3 DAYS Performed at [facility], [address], [place], [address] [place]    Report Status PENDING  Incomplete  Blood Culture (routine x 2)     Status: None (Preliminary result)   Collection  Time: 08/07/19  2:34 AM   Specimen: BLOOD  Result Value Ref Range Status   Specimen Description BLOOD RIGHT ANTECUBITAL  Final   Special Requests   Final    BOTTLES DRAWN AEROBIC AND ANAEROBIC Blood Culture adequate volume   Culture   Final    NO GROWTH 3 DAYS Performed at [facility], [address], [place], [address] [place]    Report Status PENDING  Incomplete  SARS Coronavirus 2 by RT PCR (hospital order, performed in [facility] hospital lab) Nasopharyngeal Nasopharyngeal Swab     Status: None   Collection Time: 08/07/19  3:06 AM   Specimen: Nasopharyngeal Swab  Result Value Ref  Range Status   SARS Coronavirus 2 NEGATIVE NEGATIVE Final    Comment: (NOTE) SARS-CoV-2 target nucleic acids are NOT DETECTED.  The SARS-CoV-2 RNA is generally detectable in upper and lower respiratory specimens during the acute phase of infection. The lowest concentration of SARS-CoV-2 viral copies this assay can detect is 250 copies / mL. A negative result does not preclude SARS-CoV-2 infection and should not be used as the sole basis for treatment or other patient management decisions.  A negative result may occur with improper specimen collection / handling, submission of specimen other than nasopharyngeal swab, presence of viral mutation(s) within the areas targeted by this assay, and inadequate number of viral copies (<250 copies / mL). A negative result must be combined with clinical observations, patient history, and epidemiological information.  Fact Sheet for Patients:   BoilerBrush.com.cy  Fact Sheet for Healthcare Providers: https://pope.com/  This test is not yet approved or  cleared by the [place] FDA and has been authorized for detection and/or diagnosis of SARS-CoV-2 by FDA under an Emergency Use Authorization (EUA).  This EUA will remain in effect (meaning this test can be used) for the duration of the COVID-19  declaration under Section 564(b)(1) of the Act, 21 U.S.C. section 360bbb-3(b)(1), unless the authorization is terminated or revoked sooner.  Performed at [facility], [address] [place], [place] [address]   Urine culture     Status: Abnormal   Collection Time: 08/07/19  5:21 AM   Specimen: In/Out Cath Urine  Result Value Ref Range Status   Specimen Description   Final    IN/OUT CATH URINE Performed at [facility], [address], [place], [place] [address]    Special Requests   Final    NONE Performed at [facility], [address] [place], [place] [address]    Culture (A)  Final    <10,000 COLONIES/mL INSIGNIFICANT GROWTH Performed at [facility], [address], [place], [place] [address]    Report Status 08/08/2019 FINAL  Final      Radiology Studies: No results found.   Scheduled Meds: . enoxaparin (LOVENOX) injection  40 mg Subcutaneous Q24H  . insulin aspart  0-15 Units Subcutaneous TID WC  . insulin aspart  7 Units Subcutaneous TID WC  . insulin detemir  30 Units Subcutaneous Daily  . pantoprazole  40 mg Oral Daily   Continuous Infusions: . piperacillin-tazobactam (ZOSYN)  IV 3.375 g (08/10/19 1114)  . vancomycin 1,000 mg (08/10/19 1717)     LOS: 3 days     Darlin Priestly, MD [facility] If 7PM-7AM, please contact night-coverage 08/10/2019, 5:41 PM

## 2019-08-10 NOTE — Progress Notes (Signed)
Pharmacy Antibiotic Note  Kendra Perkins is a 39 y.o. female admitted on 08/07/2019 with cellulitis.  Pharmacy has been consulted for Vancomycin and Zosyn dosing.  Plan: Day 4-  Zosyn 3.375g IV q8h (4 hour infusion).   Day 4- Will adjust Vancomycin from 1000 mg IV Q 12 hrs to Q 8 hours per [facility] Vancomycin nomogram. Wt 108.6kg and normalized Crcl 145 ml/mi. ID following/ Watch renal fxn with Zosyn      Height: 5\' 6"  (167.6 cm) Weight: (!) 108.6 kg (239 lb 6.4 oz) IBW/kg (Calculated) : 59.3  Temp (24hrs), Avg:98 F (36.7 C), Min:97.9 F (36.6 C), Max:98.2 F (36.8 C)  Recent Labs  Lab 08/06/19 1815 08/06/19 2008 08/07/19 0343 08/07/19 1112 08/08/19 0406 08/08/19 0959 08/09/19 0412 08/10/19 0538  WBC 16.0*  --  16.8*  --  13.0*  --  11.9* 14.3*  CREATININE 0.63  --  0.66  --  0.63  --  0.57 0.60  LATICACIDVEN  --  2.6* 2.7* 2.5*  --  2.5*  --   --     Estimated Creatinine Clearance: 120.1 mL/min (by C-G formula based on SCr of 0.6 mg/dL).    Allergies  Allergen Reactions  . Other Itching and Other (See Comments)    Cat dander = Sneezing     Antimicrobials this admission: Vanc 7/24 >> Zosyn 7/24 >> Diflucan 7/25 >>   x 3 days  (vaginal yeast infection)  Dose adjustments this admission:   Microbiology results:  BCx:   UCx:    Sputum:    MRSA PCR:   Thank you for allowing pharmacy to be a part of this patient's care.  Monchel Pollitt A 08/10/2019 10:22 AM

## 2019-08-11 LAB — BASIC METABOLIC PANEL
Anion gap: 9 (ref 5–15)
BUN: 13 mg/dL (ref 6–20)
CO2: 30 mmol/L (ref 22–32)
Calcium: 8.5 mg/dL — ABNORMAL LOW (ref 8.9–10.3)
Chloride: 98 mmol/L (ref 98–111)
Creatinine, Ser: 0.55 mg/dL (ref 0.44–1.00)
GFR calc Af Amer: >60 mL/min (ref >60)
GFR calc non Af Amer: >60 mL/min (ref >60)
Glucose, Bld: 154 mg/dL — ABNORMAL HIGH (ref 70–99)
Potassium: 4 mmol/L (ref 3.5–5.1)
Sodium: 137 mmol/L (ref 135–145)

## 2019-08-11 LAB — GLUCOSE, CAPILLARY
Glucose-Capillary: 157 mg/dL — ABNORMAL HIGH (ref 70–99)
Glucose-Capillary: 158 mg/dL — ABNORMAL HIGH (ref 70–99)

## 2019-08-11 LAB — CBC
HCT: 35 % — ABNORMAL LOW (ref 36.0–46.0)
Hemoglobin: 12.4 g/dL (ref 12.0–15.0)
MCH: 27.9 pg (ref 26.0–34.0)
MCHC: 35.4 g/dL (ref 30.0–36.0)
MCV: 78.7 fL — ABNORMAL LOW (ref 80.0–100.0)
Platelets: 392 10*3/uL (ref 150–400)
RBC: 4.45 MIL/uL (ref 3.87–5.11)
RDW: 12.9 % (ref 11.5–15.5)
WBC: 15.6 10*3/uL — ABNORMAL HIGH (ref 4.0–10.5)
nRBC: 0 % (ref 0.0–0.2)

## 2019-08-11 LAB — MAGNESIUM: Magnesium: 1.6 mg/dL — ABNORMAL LOW (ref 1.7–2.4)

## 2019-08-11 MED ORDER — AMOXICILLIN-POT CLAVULANATE 875-125 MG PO TABS: 1.0000 | ORAL_TABLET | Freq: Two times a day (BID) | ORAL | 0 refills | Status: AC

## 2019-08-11 MED ORDER — LISINOPRIL 10 MG PO TABS
10.0000 mg | ORAL_TABLET | Freq: Every day | ORAL | Status: DC
  Administered 2019-08-11: 10 mg via ORAL
  Filled 2019-08-11: qty 1

## 2019-08-11 MED ORDER — AMOXICILLIN-POT CLAVULANATE 875-125 MG PO TABS
1.0000 | ORAL_TABLET | Freq: Two times a day (BID) | ORAL | Status: DC
  Administered 2019-08-11: 1 via ORAL
  Filled 2019-08-11 (×2): qty 1

## 2019-08-11 NOTE — Progress Notes (Signed)
[facility]  - [place] [facility]   PATIENT NAME: Kendra Perkins    MR#:  [id number]  DATE OF BIRTH:  06-24-1980  SUBJECTIVE:   Patient overall doing better. Some pain on the left thumb. No plural on discharge. No fever. Overall improving.  She is wondering when she'll get to go home. REVIEW OF SYSTEMS:   Review of Systems  Constitutional: Negative for chills, fever and weight loss.  HENT: Negative for ear discharge, ear pain and nosebleeds.   Eyes: Negative for blurred vision, pain and discharge.  Respiratory: Negative for sputum production, shortness of breath, wheezing and stridor.   Cardiovascular: Negative for chest pain, palpitations, orthopnea and PND.  Gastrointestinal: Negative for abdominal pain, diarrhea, nausea and vomiting.  Genitourinary: Negative for frequency and urgency.  Musculoskeletal: Negative for back pain and joint pain.  Neurological: Negative for sensory change, speech change, focal weakness and weakness.  Psychiatric/Behavioral: Negative for depression and hallucinations. The patient is not nervous/anxious.    Tolerating Diet:yes Tolerating PT: ambulatory  DRUG ALLERGIES:   Allergies  Allergen Reactions  . Other Itching and Other (See Comments)    Cat dander = Sneezing     VITALS:  Blood pressure (!) 116/61, pulse 73, temperature 97.8 F (36.6 C), temperature source Oral, resp. rate 16, height 5\' 6"  (1.676 m), weight (!) 108.6 kg, last menstrual period 07/26/2019, SpO2 98 %, unknown if currently breastfeeding.  PHYSICAL EXAMINATION:   Physical Exam  GENERAL:  39 y.o.-year-old patient lying in the bed with no acute distress. obese EYES: Pupils equal, round, reactive to light and accommodation. No scleral icterus.   HEENT: Head atraumatic, normocephalic. Oropharynx and nasopharynx clear.  NECK:  Supple, no jugular venous distention. No thyroid enlargement, no tenderness.  LUNGS: Normal breath sounds bilaterally, no  wheezing, rales, rhonchi. No use of accessory muscles of respiration.  CARDIOVASCULAR: S1, S2 normal. No murmurs, rubs, or gallops.  ABDOMEN: Soft, nontender, nondistended. Bowel sounds present. No organomegaly or mass.  EXTREMITIES:left hand    NEUROLOGIC: Cranial nerves II through XII are intact. No focal Motor or sensory deficits b/l.   PSYCHIATRIC:  patient is alert and oriented x 3.  SKIN: No obvious rash, lesion, or ulcer.   LABORATORY PANEL:  CBC Recent Labs  Lab 08/11/19 0536  WBC 15.6*  HGB 12.4  HCT 35.0*  PLT 392    Chemistries  Recent Labs  Lab 08/06/19 1815 08/07/19 0343 08/11/19 0536  NA 135   < > 137  K 3.4*   < > 4.0  CL 100   < > 98  CO2 23   < > 30  GLUCOSE 275*   < > 154*  BUN 11   < > 13  CREATININE 0.63   < > 0.55  CALCIUM 9.1   < > 8.5*  MG  --    < > 1.6*  AST 17  --   --   ALT 17  --   --   ALKPHOS 105  --   --   BILITOT 0.6  --   --    < > = values in this interval not displayed.   Cardiac Enzymes No results for input(s): TROPONINI in the last 168 hours. RADIOLOGY:  No results found. ASSESSMENT AND PLAN:  Diane L Hinshawis a 39 y.o.femalewith medical history significant fordiabetes, with infection of the distal left thumb for which she has had repeated visits to the emergency room with drainage of paronychia followed by drainage for  possible felon, on outpatient antibiotic treatment who returns to the emergency room because of continued pain at the site of prior drainage, now extending to the palmar surface of the hand. She denies fever or chills.    Sepsis (HCC) POA Early Osteomyelitis of left thumb -tachycardia, leukocytosis, elevated lactic acid.  Patient presents with recurrent pain of the left thumb not responding to oral antibiotics and attempted drainage from the emergency room -sepsis resolved --MRI showed early osteo -On IV Zosyn and vancomycin --ortho recommended abx treatment for now, no surgical intervention planned.  Pt needs to see Dr Stephenie Acres (hand surgeon at [facility]) as out pt --ID consult --appreciated --tramadol PRN for pain  Hyperglycemia due to type 2 diabetes mellitus (HCC) --continue home Levemir 30u daily (down from 35) --ACHS and SSI TID --continue meal-time 7u TID  GERD --continue home PPI   Vaginal yeast infection --from abx, per pt report --Diflucan 100 mg x 3days    DVT prophylaxis: Lovenox SQ Code Status: Full code  Family Communication: daughter updated at bedside today Status is: inpatient Dispo:   The patient is from: home Anticipated d/c is to: home Anticipated d/c date ZO:XWRUE or tomorrow Patient currently is \ medically stable to d/c due to: on IV empiric abx for now  TOTAL TIME TAKING CARE OF THIS PATIENT: 22** minutes.  >50% time spent on counselling and coordination of care  Note: This dictation was prepared with Dragon dictation along with smaller phrase technology. Any transcriptional errors that result from this process are unintentional.  Enedina Finner M.D    [facility]   CC: Primary care physician; [facility]Patient ID: NAFISAH RUNIONS, female   DOB: Dec 23, 1980, 39 y.o.   MRN: [id number]

## 2019-08-11 NOTE — Discharge Instructions (Signed)
Continue hand hygiene

## 2019-08-11 NOTE — Progress Notes (Signed)
ID Pt was seen yetserday and the thumb looked better There was a slight increase in wbc Pt is being transitioned to augmentin fom zosyn and vanco and will be followed as OP with hand surgery and ID I have given her an appt for next Tuesday at 9.30am

## 2019-08-11 NOTE — Discharge Summary (Signed)
[facility] [place] [facility]   PATIENT NAME: Kendra Perkins    MR#:  [id number]  DATE OF BIRTH:  October 09, 1980  DATE OF ADMISSION:  08/07/2019 ADMITTING PHYSICIAN: Andris BaumannHazel Duncan V, MD  DATE OF DISCHARGE: 08/11/2019  PRIMARY CARE PHYSICIAN: [facility]    ADMISSION DIAGNOSIS:  Cellulitis of thumb, left [L03.012] Sepsis (HCC) [A41.9] Sepsis, due to unspecified organism, unspecified whether acute organ dysfunction present (HCC) [A41.9]  DISCHARGE DIAGNOSIS:  sepsis on admission resolved cellulitis/early osteomyelitis left distal thumb  SECONDARY DIAGNOSIS:   Past Medical History:  Diagnosis Date  . Arthritis    lower back  . Asthma    rarely uses inhaler  . Back pain 2000  . Depression    no meds  . Diabetes mellitus without complication (HCC)    borderline - no meds  . GERD (gastroesophageal reflux disease)    occasional - diet controlled    HOSPITAL COURSE:   Kendra L Hinshawis a 39 y.o.femalewith medical history significant fordiabetes, with infection of the distal left thumb for which she has had repeated visits to the emergency room with drainage of paronychia followed by drainage for possible felon, on outpatient antibiotic treatment who returns to the emergency room because of continued pain at the site of prior drainage, now extending to the palmar surface of the hand. She denies fever or chills.   Sepsis (HCC) POA Early Osteomyelitis of left thumb -tachycardia, leukocytosis, elevated lactic acid. Patient presents with recurrent pain of the left thumb not responding to oral antibiotics and attempted drainage from the emergency room -sepsis resolved --MRI showed early osteo -On IV Zosyn and vancomycin-- change to oral Augmentin for seven more days --ortho recommended abx treatment for now, no surgical intervention planned. Pt needs to see Dr [name] (hand surgeon at [facility] as out pt 7/29/ 2021 --ID consult  --appreciated change to oral Augmentin and follow-up ID  in one week --tramadol PRN for pain  Hyperglycemia due to type 2 diabetes mellitus (HCC) --continue home Levemir 35 units qd --ACHS and SSI TID --continue meal-time 7u TID  GERD --continue home PPI  Vaginal yeast infection --from abx, per pt report --Diflucan 100 mg x 3days--completed    DVT prophylaxis:Lovenox SQ Code Status:Full code Family Communication:none today Status WU:[id number]:inpatient Dispo: The patient is from: home Anticipated d/c is to: home Anticipated d/c date GN:FAOZHis:today  Patient currently is  medically stable to d/c CONSULTS OBTAINED:  Treatment Team:  Lynn Itoavishankar, Jayashree, MD  DRUG ALLERGIES:   Allergies  Allergen Reactions  . Other Itching and Other (See Comments)    Cat dander = Sneezing     DISCHARGE MEDICATIONS:   Allergies as of 08/11/2019      Reactions   Other Itching, Other (See Comments)   Cat dander = Sneezing       Medication List    STOP taking these medications   doxycycline 100 MG capsule Commonly known as: VIBRAMYCIN     TAKE these medications   albuterol 108 (90 Base) MCG/ACT inhaler Commonly known as: VENTOLIN HFA Inhale 2 puffs into the lungs every 6 (six) hours as needed for wheezing or shortness of breath.   amoxicillin-clavulanate 875-125 MG tablet Commonly known as: AUGMENTIN Take 1 tablet by mouth every 12 (twelve) hours for 7 days.   cyclobenzaprine 5 MG tablet Commonly known as: FLEXERIL Take 5 mg by mouth at bedtime as needed for muscle spasms.   HYDROcodone-acetaminophen 5-325 MG tablet Commonly known as: NORCO/VICODIN Take 1  tablet by mouth every 6 (six) hours as needed for moderate pain. For acute pain not controlled by regular medication   insulin detemir 100 UNIT/ML injection Commonly known as: LEVEMIR Inject 35 Units into the skin daily.   lisinopril 10 MG tablet Commonly known as: ZESTRIL Take 10 mg by mouth daily.   naproxen  500 MG tablet Commonly known as: NAPROSYN Take 500 mg by mouth 2 (two) times daily as needed for mild pain or moderate pain.   omeprazole 40 MG capsule Commonly known as: PRILOSEC Take 40 mg by mouth as needed (for reflux).   traMADol 50 MG tablet Commonly known as: ULTRAM Take 50-100 mg by mouth every 6 (six) hours as needed for moderate pain or severe pain.       If you experience worsening of your admission symptoms, develop shortness of breath, life threatening emergency, suicidal or homicidal thoughts you must seek medical attention immediately by calling 911 or calling your MD immediately  if symptoms less severe.  You Must read complete instructions/literature along with all the possible adverse reactions/side effects for all the Medicines you take and that have been prescribed to you. Take any new Medicines after you have completely understood and accept all the possible adverse reactions/side effects.   Please note  You were cared for by a hospitalist during your hospital stay. If you have any questions about your discharge medications or the care you received while you were in the hospital after you are discharged, you can call the unit and asked to speak with the hospitalist on call if the hospitalist that took care of you is not available. Once you are discharged, your primary care physician will handle any further medical issues. Please note that NO REFILLS for any discharge medications will be authorized once you are discharged, as it is imperative that you return to your primary care physician (or establish a relationship with a primary care physician if you do not have one) for your aftercare needs so that they can reassess your need for medications and monitor your lab values.   DATA REVIEW:   CBC  Recent Labs  Lab 08/11/19 0536  WBC 15.6*  HGB 12.4  HCT 35.0*  PLT 392    Chemistries  Recent Labs  Lab 08/06/19 1815 08/07/19 0343 08/11/19 0536  NA 135   < >  137  K 3.4*   < > 4.0  CL 100   < > 98  CO2 23   < > 30  GLUCOSE 275*   < > 154*  BUN 11   < > 13  CREATININE 0.63   < > 0.55  CALCIUM 9.1   < > 8.5*  MG  --    < > 1.6*  AST 17  --   --   ALT 17  --   --   ALKPHOS 105  --   --   BILITOT 0.6  --   --    < > = values in this interval not displayed.    Microbiology Results   Recent Results (from the past 240 hour(s))  Blood Culture (routine x 2)     Status: None (Preliminary result)   Collection Time: 08/07/19  2:34 AM   Specimen: BLOOD  Result Value Ref Range Status   Specimen Description BLOOD BLOOD RIGHT HAND  Final   Special Requests   Final    BOTTLES DRAWN AEROBIC AND ANAEROBIC Blood Culture adequate volume   Culture   Final  NO GROWTH 4 DAYS Performed at [facility], [address] [place], [place] [address]    Report Status PENDING  Incomplete  Blood Culture (routine x 2)     Status: None (Preliminary result)   Collection Time: 08/07/19  2:34 AM   Specimen: BLOOD  Result Value Ref Range Status   Specimen Description BLOOD RIGHT ANTECUBITAL  Final   Special Requests   Final    BOTTLES DRAWN AEROBIC AND ANAEROBIC Blood Culture adequate volume   Culture   Final    NO GROWTH 4 DAYS Performed at [facility], [address], [place], [place] [address]    Report Status PENDING  Incomplete  SARS Coronavirus 2 by RT PCR (hospital order, performed in [facility] hospital lab) Nasopharyngeal Nasopharyngeal Swab     Status: None   Collection Time: 08/07/19  3:06 AM   Specimen: Nasopharyngeal Swab  Result Value Ref Range Status   SARS Coronavirus 2 NEGATIVE NEGATIVE Final    Comment: (NOTE) SARS-CoV-2 target nucleic acids are NOT DETECTED.  The SARS-CoV-2 RNA is generally detectable in upper and lower respiratory specimens during the acute phase of infection. The lowest concentration of SARS-CoV-2 viral copies this assay can detect is 250 copies / mL. A negative result does not preclude  SARS-CoV-2 infection and should not be used as the sole basis for treatment or other patient management decisions.  A negative result may occur with improper specimen collection / handling, submission of specimen other than nasopharyngeal swab, presence of viral mutation(s) within the areas targeted by this assay, and inadequate number of viral copies (<250 copies / mL). A negative result must be combined with clinical observations, patient history, and epidemiological information.  Fact Sheet for Patients:   BoilerBrush.com.cy  Fact Sheet for Healthcare Providers: https://pope.com/  This test is not yet approved or  cleared by the [place] FDA and has been authorized for detection and/or diagnosis of SARS-CoV-2 by FDA under an Emergency Use Authorization (EUA).  This EUA will remain in effect (meaning this test can be used) for the duration of the COVID-19 declaration under Section 564(b)(1) of the Act, 21 U.S.C. section 360bbb-3(b)(1), unless the authorization is terminated or revoked sooner.  Performed at [facility], [address] [place], [place] [address]   Urine culture     Status: Abnormal   Collection Time: 08/07/19  5:21 AM   Specimen: In/Out Cath Urine  Result Value Ref Range Status   Specimen Description   Final    IN/OUT CATH URINE Performed at [facility], [address], [place], [place] [address]    Special Requests   Final    NONE Performed at [facility], [address] [place], [place] [address]    Culture (A)  Final    <10,000 COLONIES/mL INSIGNIFICANT GROWTH Performed at [facility], [address], [place], [place] [address]    Report Status 08/08/2019 FINAL  Final    RADIOLOGY:  No results found.   CODE STATUS:     Code Status Orders  (From admission, onward)         Start     Ordered   08/07/19 0324  Full code  Continuous        08/07/19  0326        Code Status History    This patient has a current code status but no historical code status.   Advance Care Planning Activity  TOTAL TIME TAKING CARE OF THIS PATIENT: *35* minutes.    Enedina Finner M.D  [facility]    CC: Primary care physician; [facility], [facility]

## 2019-08-12 LAB — CULTURE, BLOOD (ROUTINE X 2)
Culture: NO GROWTH
Culture: NO GROWTH
Special Requests: ADEQUATE
Special Requests: ADEQUATE

## 2019-08-17 ENCOUNTER — Ambulatory Visit: Payer: Medicaid Other | Attending: Infectious Diseases | Admitting: Infectious Diseases

## 2019-08-17 ENCOUNTER — Other Ambulatory Visit: Payer: Self-pay

## 2019-08-17 ENCOUNTER — Encounter: Payer: Self-pay | Admitting: Infectious Diseases

## 2019-08-17 VITALS — BP 135/88 | HR 104 | Temp 98.3°F | Resp 16 | Ht 66.0 in | Wt 243.0 lb

## 2019-08-17 DIAGNOSIS — M1990 Unspecified osteoarthritis, unspecified site: Secondary | ICD-10-CM | POA: Insufficient documentation

## 2019-08-17 DIAGNOSIS — F17210 Nicotine dependence, cigarettes, uncomplicated: Secondary | ICD-10-CM | POA: Diagnosis not present

## 2019-08-17 DIAGNOSIS — E119 Type 2 diabetes mellitus without complications: Secondary | ICD-10-CM | POA: Diagnosis not present

## 2019-08-17 DIAGNOSIS — L03012 Cellulitis of left finger: Secondary | ICD-10-CM | POA: Diagnosis present

## 2019-08-17 DIAGNOSIS — Z791 Long term (current) use of non-steroidal anti-inflammatories (NSAID): Secondary | ICD-10-CM | POA: Diagnosis not present

## 2019-08-17 DIAGNOSIS — K219 Gastro-esophageal reflux disease without esophagitis: Secondary | ICD-10-CM | POA: Insufficient documentation

## 2019-08-17 DIAGNOSIS — Z794 Long term (current) use of insulin: Secondary | ICD-10-CM | POA: Diagnosis not present

## 2019-08-17 NOTE — Progress Notes (Signed)
NAME: Kendra Perkins  DOB: [birth year]  MRN: [id number]  Date/Time: 08/17/2019 9:36 AM   Subjective:  Follow up after recent hospitalization for left thumb paronychia ? Kendra Perkins is a 39 y.o. with a history of Past Medical History:  Diagnosis Date  . Arthritis    lower back  . Asthma    rarely uses inhaler  . Back pain 2000  . Depression    no meds  . Diabetes mellitus without complication (HCC)    borderline - no meds  . GERD (gastroesophageal reflux disease)    occasional - diet controlled    Past Surgical History:  Procedure Laterality Date  . APPENDECTOMY     ?. pt thinks she had it removed (laparoscopically)  . CESAREAN SECTION  2002, 2003   x 2  . DILATION AND EVACUATION N/A 11/08/2015   Procedure: DILATATION AND EVACUATION;  Surgeon: Tereso Newcomer, MD;  Location: [facility];  Service: Gynecology;  Laterality: N/A;  . WISDOM TOOTH EXTRACTION      Social History   Socioeconomic History  . Marital status: Married    Spouse name: Not on file  . Number of children: Not on file  . Years of education: Not on file  . Highest education level: Not on file  Occupational History  . Not on file  Tobacco Use  . Smoking status: Current Every Day Smoker    Packs/day: 1.00    Years: 15.00    Pack years: 15.00    Types: Cigarettes  . Smokeless tobacco: Never Used  Substance and Sexual Activity  . Alcohol use: No  . Drug use: No  . Sexual activity: Yes    Birth control/protection: None    Comment: 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  Other Topics Concern  . Not on file  Social History Narrative  . Not on file   Social Determinants of Health   Financial Resource Strain:   . Difficulty of Paying Living Expenses:   Food Insecurity:   . Worried About Programme researcher, broadcasting/film/video in the Last Year:   . Barista in the Last Year:   Transportation Needs:   . Freight forwarder (Medical):   Marland Kitchen Lack of Transportation (Non-Medical):   Physical Activity:   . Days of  Exercise per Week:   . Minutes of Exercise per Session:   Stress:   . Feeling of Stress :   Social Connections:   . Frequency of Communication with Friends and Family:   . Frequency of Social Gatherings with Friends and Family:   . Attends Religious Services:   . Active Member of Clubs or Organizations:   . Attends Banker Meetings:   Marland Kitchen Marital Status:   Intimate Partner Violence:   . Fear of Current or Ex-Partner:   . Emotionally Abused:   Marland Kitchen Physically Abused:   . Sexually Abused:     Family History  Problem Relation Age of Onset  . Cancer Mother   . Diabetes Father   . Alcohol abuse Father   . Multiple sclerosis Daughter    Allergies  Allergen Reactions  . Other Itching and Other (See Comments)    Cat dander = Sneezing    ID  Recent  Procedure Surgery Injections Trauma Sick contacts Travel Antibiotic use Food- raw/exotic Steroid/immune suppressants/splenectomy/Hardware Animal bites Tick exposure Water sports Fishing/hunting/animal bird exposure ? Current Outpatient Medications  Medication Sig Dispense Refill  . albuterol (PROVENTIL HFA;VENTOLIN HFA) 108 (90 Base) MCG/ACT inhaler  Inhale 2 puffs into the lungs every 6 (six) hours as needed for wheezing or shortness of breath.     Marland Kitchen amoxicillin-clavulanate (AUGMENTIN) 875-125 MG tablet Take 1 tablet by mouth every 12 (twelve) hours for 7 days. 14 tablet 0  . cyclobenzaprine (FLEXERIL) 5 MG tablet Take 5 mg by mouth at bedtime as needed for muscle spasms.    . insulin detemir (LEVEMIR) 100 UNIT/ML injection Inject 35 Units into the skin daily.    Marland Kitchen lisinopril (ZESTRIL) 10 MG tablet Take 10 mg by mouth daily.    . naproxen (NAPROSYN) 500 MG tablet Take 500 mg by mouth 2 (two) times daily as needed for mild pain or moderate pain.     Marland Kitchen omeprazole (PRILOSEC) 40 MG capsule Take 40 mg by mouth as needed (for reflux).     . traMADol (ULTRAM) 50 MG tablet Take 50-100 mg by mouth every 6 (six) hours as needed  for moderate pain or severe pain.     No current facility-administered medications for this visit.     Abtx:  Anti-infectives (From admission, onward)   None      REVIEW OF SYSTEMS:  Const: negative fever, negative chills, negative weight loss Eyes: negative diplopia or visual changes, negative eye pain ENT: negative coryza, negative sore throat Resp: negative cough, hemoptysis, dyspnea Cards: negative for chest pain, palpitations, lower extremity edema GU: negative for frequency, dysuria and hematuria GI: Negative for abdominal pain, diarrhea, bleeding, constipation Skin: negative for rash and pruritus Heme: negative for easy bruising and gum/nose bleeding MS: negative for myalgias, arthralgias, back pain and muscle weakness Neurolo:negative for headaches, dizziness, vertigo, memory problems  Psych: negative for feelings of anxiety, depression  Endocrine: negative for thyroid, diabetes Allergy/Immunology- negative for any medication or food allergies ? Pertinent Positives include : Objective:  VITALS:  BP 135/88   Pulse (!) 104   Temp 98.3 F (36.8 C)   Resp 16   Ht 5\' 6"  (1.676 m)   Wt 243 lb (110.2 kg)   LMP 07/26/2019   SpO2 97%   BMI 39.22 kg/m  PHYSICAL EXAM:  General: Alert, cooperative, no distress, appears stated age.  Head: Normocephalic, without obvious abnormality, atraumatic. Eyes: Conjunctivae clear, anicteric sclerae. Pupils are equal ENT Nares normal. No drainage or sinus tenderness. Lips, mucosa, and tongue normal. No Thrush Neck: Supple, symmetrical, no adenopathy, thyroid: non tender no carotid bruit and no JVD. Back: No CVA tenderness. Lungs: Clear to auscultation bilaterally. No Wheezing or Rhonchi. No rales. Heart: Regular rate and rhythm, no murmur, rub or gallop. Abdomen: Soft, non-tender,not distended. Bowel sounds normal. No masses Extremities: atraumatic, no cyanosis. No edema. No clubbing Skin: No rashes or lesions. Or bruising Lymph:  Cervical, supraclavicular normal. Neurologic: Grossly non-focal Pertinent Labs Lab Results CBC    Component Value Date/Time   WBC 15.6 (H) 08/11/2019 0536   RBC 4.45 08/11/2019 0536   HGB 12.4 08/11/2019 0536   HGB 13.4 12/12/2011 2049   HCT 35.0 (L) 08/11/2019 0536   HCT 39.3 12/12/2011 2049   PLT 392 08/11/2019 0536   PLT 442 (H) 12/12/2011 2049   MCV 78.7 (L) 08/11/2019 0536   MCV 80 12/12/2011 2049   MCH 27.9 08/11/2019 0536   MCHC 35.4 08/11/2019 0536   RDW 12.9 08/11/2019 0536   RDW 14.2 12/12/2011 2049   LYMPHSABS 5.1 (H) 08/06/2019 1815   LYMPHSABS 6.0 (H) 12/12/2011 2049   MONOABS 0.7 08/06/2019 1815   MONOABS 0.8 12/12/2011 2049   EOSABS  0.3 08/06/2019 1815   EOSABS 0.4 12/12/2011 2049   BASOSABS 0.1 08/06/2019 1815   BASOSABS 0.0 12/12/2011 2049    CMP Latest Ref Rng & Units 08/11/2019 08/10/2019 08/09/2019  Glucose 70 - 99 mg/dL 638(G) 665(L) 935(T)  BUN 6 - 20 mg/dL 13 13 12   Creatinine 0.44 - 1.00 mg/dL 0.17 7.93  Sodium 135 - 145 mmol/L 137 137 141  Potassium 3.5 - 5.1 mmol/L 4.0 4.0 3.9  Chloride 98 - 111 mmol/L 98 105 110  CO2 22 - 32 mmol/L 30 24 26   Calcium 8.9 - 10.3 mg/dL 9.03) ) 8.1(L)  Total Protein 6.5 - 8.1 g/dL - - -  Total Bilirubin 0.3 - 1.2 mg/dL - - -  Alkaline Phos 38 - 126 U/L - - -  AST 15 - 41 U/L - - -  ALT 0 - 44 U/L - - -      Microbiology: No results found for this or any previous visit (from the past 240 hour(s)).  IMAGING RESULTS: I have personally reviewed the films ? Impression/Recommendation ? ? ? ___________________________________________________ Discussed with patient, requesting provider Note:  This document was prepared using Dragon voice recognition software and may include unintentional dictation errors.

## 2019-08-17 NOTE — Patient Instructions (Signed)
Are here for follow up after recent hospitalization for paronychia- your thumb nail and tissue is looking much better- you still have a black spot which could be blood clot- but would recommend you see the hand surgeon ( you had missed that appt)  if this is not getting better .

## 2019-08-17 NOTE — Progress Notes (Signed)
NAME: Kendra Perkins  DOB: Feb 21, 1980  MRN: [id number]  Date/Time: 08/17/2019 9:36 AM   Subjective:  Follow up after recent hospitalization for left thumb paronychia 7/24--/7/28 She had I/D before in the ED On admisison she got vanco and zosyn and was trnasitioned to augmentin on discharge Was asked to see hand sugeon on 08/12/19 but she could not make it. She is doing much better Still has some discoloration  Past Medical History:  Diagnosis Date  . Arthritis    lower back  . Asthma    rarely uses inhaler  . Back pain 2000  . Depression    no meds  . Diabetes mellitus without complication (HCC)    borderline - no meds  . GERD (gastroesophageal reflux disease)    occasional - diet controlled    Past Surgical History:  Procedure Laterality Date  . APPENDECTOMY     ?. pt thinks she had it removed (laparoscopically)  . CESAREAN SECTION  2002, 2003   x 2  . DILATION AND EVACUATION N/A 11/08/2015   Procedure: DILATATION AND EVACUATION;  Surgeon: Tereso Newcomer, MD;  Location: [facility];  Service: Gynecology;  Laterality: N/A;  . WISDOM TOOTH EXTRACTION      Social History   Socioeconomic History  . Marital status: Married    Spouse name: Not on file  . Number of children: Not on file  . Years of education: Not on file  . Highest education level: Not on file  Occupational History  . Not on file  Tobacco Use  . Smoking status: Current Every Day Smoker    Packs/day: 1.00    Years: 15.00    Pack years: 15.00    Types: Cigarettes  . Smokeless tobacco: Never Used  Substance and Sexual Activity  . Alcohol use: No  . Drug use: No  . Sexual activity: Yes    Birth control/protection: None    Comment: 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  Other Topics Concern  . Not on file  Social History Narrative  . Not on file   Social Determinants of Health   Financial Resource Strain:   . Difficulty of Paying Living Expenses:   Food Insecurity:   . Worried About Programme researcher, broadcasting/film/video in  the Last Year:   . Barista in the Last Year:   Transportation Needs:   . Freight forwarder (Medical):   Marland Kitchen Lack of Transportation (Non-Medical):   Physical Activity:   . Days of Exercise per Week:   . Minutes of Exercise per Session:   Stress:   . Feeling of Stress :   Social Connections:   . Frequency of Communication with Friends and Family:   . Frequency of Social Gatherings with Friends and Family:   . Attends Religious Services:   . Active Member of Clubs or Organizations:   . Attends Banker Meetings:   Marland Kitchen Marital Status:   Intimate Partner Violence:   . Fear of Current or Ex-Partner:   . Emotionally Abused:   Marland Kitchen Physically Abused:   . Sexually Abused:     Family History  Problem Relation Age of Onset  . Cancer Mother   . Diabetes Father   . Alcohol abuse Father   . Multiple sclerosis Daughter    Allergies  Allergen Reactions  . Other Itching and Other (See Comments)    Cat dander = Sneezing    ? Current Outpatient Medications  Medication Sig Dispense Refill  . albuterol (  PROVENTIL HFA;VENTOLIN HFA) 108 (90 Base) MCG/ACT inhaler Inhale 2 puffs into the lungs every 6 (six) hours as needed for wheezing or shortness of breath.     Marland Kitchen amoxicillin-clavulanate (AUGMENTIN) 875-125 MG tablet Take 1 tablet by mouth every 12 (twelve) hours for 7 days. 14 tablet 0  . cyclobenzaprine (FLEXERIL) 5 MG tablet Take 5 mg by mouth at bedtime as needed for muscle spasms.    . insulin detemir (LEVEMIR) 100 UNIT/ML injection Inject 35 Units into the skin daily.    Marland Kitchen lisinopril (ZESTRIL) 10 MG tablet Take 10 mg by mouth daily.    . naproxen (NAPROSYN) 500 MG tablet Take 500 mg by mouth 2 (two) times daily as needed for mild pain or moderate pain.     Marland Kitchen omeprazole (PRILOSEC) 40 MG capsule Take 40 mg by mouth as needed (for reflux).     . traMADol (ULTRAM) 50 MG tablet Take 50-100 mg by mouth every 6 (six) hours as needed for moderate pain or severe pain.     No  current facility-administered medications for this visit.     Abtx:  Anti-infectives (From admission, onward)   None      REVIEW OF SYSTEMS:  Const: negative fever, negative chills, negative weight loss Eyes: negative diplopia or visual changes, negative eye pain ENT: negative coryza, negative sore throat Resp: negative cough, hemoptysis, dyspnea Cards: negative for chest pain, palpitations, lower extremity edema GU: negative for frequency, dysuria and hematuria GI: Negative for abdominal pain, diarrhea, bleeding, constipation Skin: negative for rash and pruritus Heme: negative for easy bruising and gum/nose bleeding MS: negative for myalgias, arthralgias, back pain and muscle weakness Neurolo:negative for headaches, dizziness, vertigo, memory problems  Psych: negative for feelings of anxiety, depression  Endocrine: negative for thyroid, diabetes Allergy/Immunology- negative for any medication or food allergies ?  Objective:  VITALS:  BP 135/88   Pulse (!) 104   Temp 98.3 F (36.8 C)   Resp 16   Ht 5\' 6"  (1.676 m)   Wt 243 lb (110.2 kg)   LMP 07/26/2019   SpO2 97%   BMI 39.22 kg/m  PHYSICAL EXAM:  General: Alert, cooperative, no distress, appears stated age.  Lungs: Clear to auscultation bilaterally. No Wheezing or Rhonchi. No rales. Heart: Regular rate and rhythm, no murmur, rub or gallop. Extremities:  Thumb 08/17/19     Last week      Lymph: Cervical, supraclavicular normal. Neurologic: Grossly non-focal Pertinent Labs Lab Results CBC    Component Value Date/Time   WBC 15.6 (H) 08/11/2019 0536   RBC 4.45 08/11/2019 0536   HGB 12.4 08/11/2019 0536   HGB 13.4 12/12/2011 2049   HCT 35.0 (L) 08/11/2019 0536   HCT 39.3 12/12/2011 2049   PLT 392 08/11/2019 0536   PLT 442 (H) 12/12/2011 2049   MCV 78.7 (L) 08/11/2019 0536   MCV 80 12/12/2011 2049   MCH 27.9 08/11/2019 0536   MCHC 35.4 08/11/2019 0536   RDW 12.9 08/11/2019 0536   RDW 14.2  12/12/2011 2049   LYMPHSABS 5.1 (H) 08/06/2019 1815   LYMPHSABS 6.0 (H) 12/12/2011 2049   MONOABS 0.7 08/06/2019 1815   MONOABS 0.8 12/12/2011 2049   EOSABS 0.3 08/06/2019 1815   EOSABS 0.4 12/12/2011 2049   BASOSABS 0.1 08/06/2019 1815   BASOSABS 0.0 12/12/2011 2049    CMP Latest Ref Rng & Units 08/11/2019 08/10/2019 08/09/2019  Glucose 70 - 99 mg/dL 08/11/2019) 175(Z) 025(E)  BUN 6 - 20 mg/dL 13 13 12  Creatinine 0.44 - 1.00 mg/dL 2.80 0.34 9.17  Sodium 135 - 145 mmol/L 137 137 141  Potassium 3.5 - 5.1 mmol/L 4.0 4.0 3.9  Chloride 98 - 111 mmol/L 98 105 110  CO2 22 - 32 mmol/L 30 24 26   Calcium 8.9 - 10.3 mg/dL ) 9.1(T) 8.1(L)  Total Protein 6.5 - 8.1 g/dL - - -  Total Bilirubin 0.3 - 1.2 mg/dL - - -  Alkaline Phos 38 - 126 U/L - - -  AST 15 - 41 U/L - - -  ALT 0 - 44 U/L - - -    ? Impression/Recommendation ?left thumb paronychia and pulp infection- s/p I/D but no cultures were sent- she got IV antibiotic while in the hospital and discharged on PO augmentin and doing well. She still has some discoloration of the nail bed- if not improving will have to see hand surgeon . Otherwise finish antibiotic( has 2 days left)  Follow PRN Discussed the management with patient Asked her to be careful during manicure and not to push the cuticle ? ? Note:  This document was prepared using Dragon voice recognition software and may include unintentional dictation errors.

## 2019-08-31 ENCOUNTER — Ambulatory Visit
Admission: RE | Admit: 2019-08-31 | Discharge: 2019-08-31 | Disposition: A | Discharge status: Home / Self Care | Payer: Medicaid Other | Source: Ambulatory Visit | Attending: Family Medicine | Admitting: Family Medicine

## 2019-08-31 ENCOUNTER — Emergency Department
Admission: EM | Admit: 2019-08-31 | Discharge: 2019-08-31 | Discharge status: Against Medical Advice | Payer: Medicaid Other

## 2019-08-31 ENCOUNTER — Other Ambulatory Visit: Payer: Self-pay | Admitting: Family Medicine

## 2019-08-31 ENCOUNTER — Other Ambulatory Visit: Payer: Self-pay

## 2019-08-31 DIAGNOSIS — M79601 Pain in right arm: Secondary | ICD-10-CM | POA: Diagnosis not present

## 2021-03-15 IMAGING — US US EXTREM  UP VENOUS*R*
1 series · 13 of 24 positions shown · non-contrast
Comparison: None.

CLINICAL DATA: Right upper extremity pain



[Series 1: us extrem up venous*right* · 0.07mm/px · 13 of 30 slices shown]
[im 1/30]
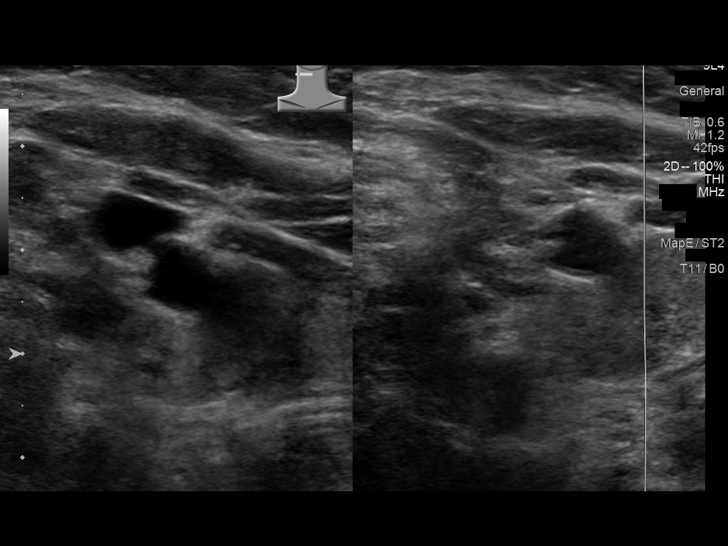
[im 3/30]
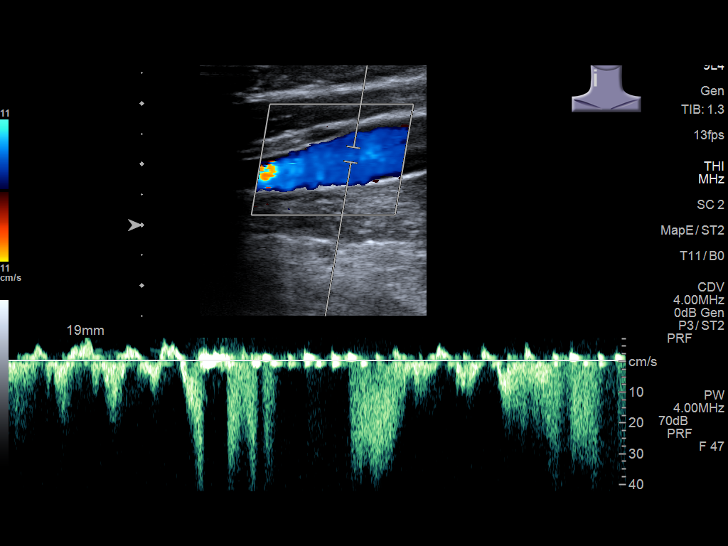
[im 6/30]
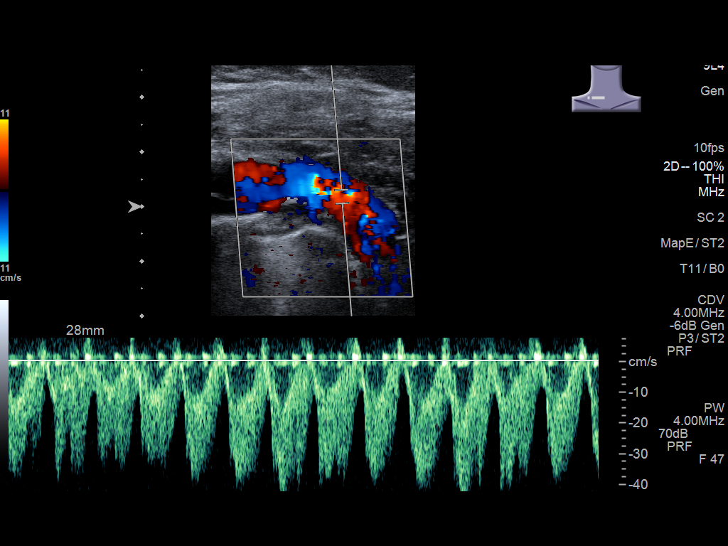
[im 8/30]
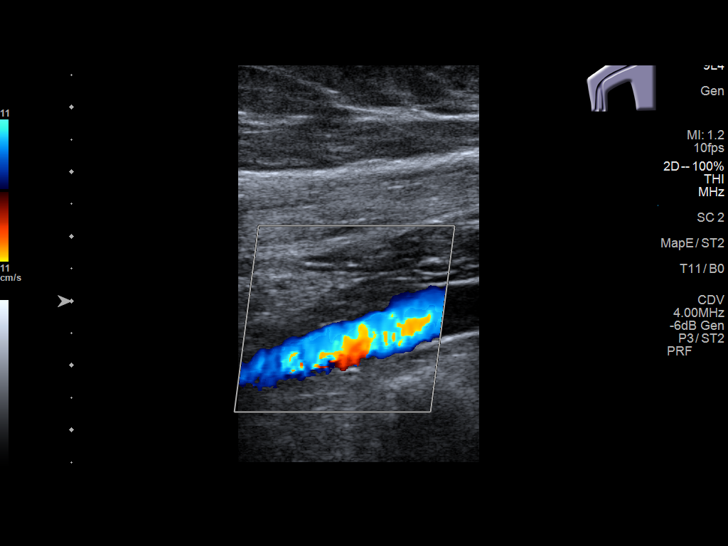
[im 11/30]
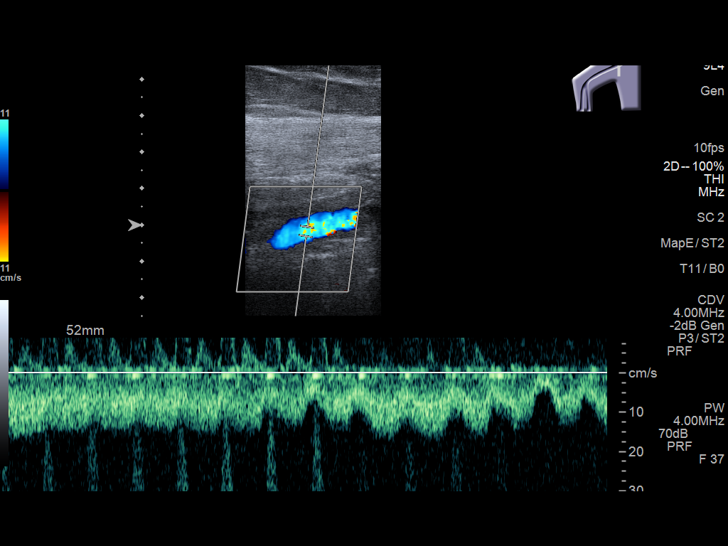
[im 13/30]
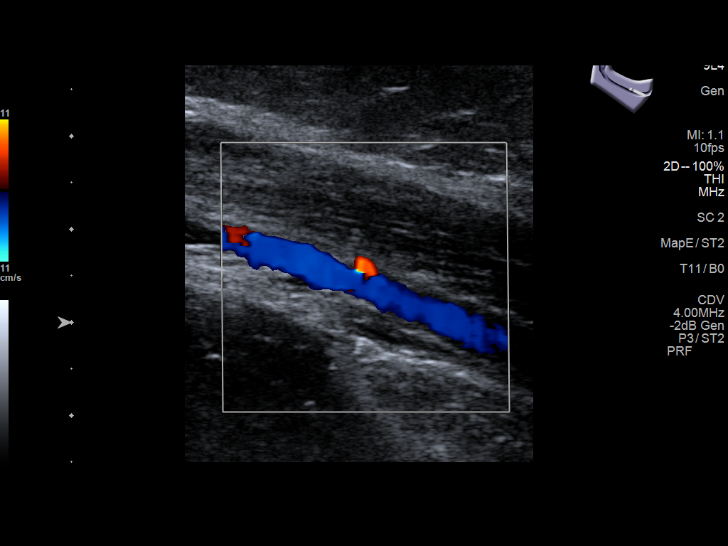
[im 16/30]
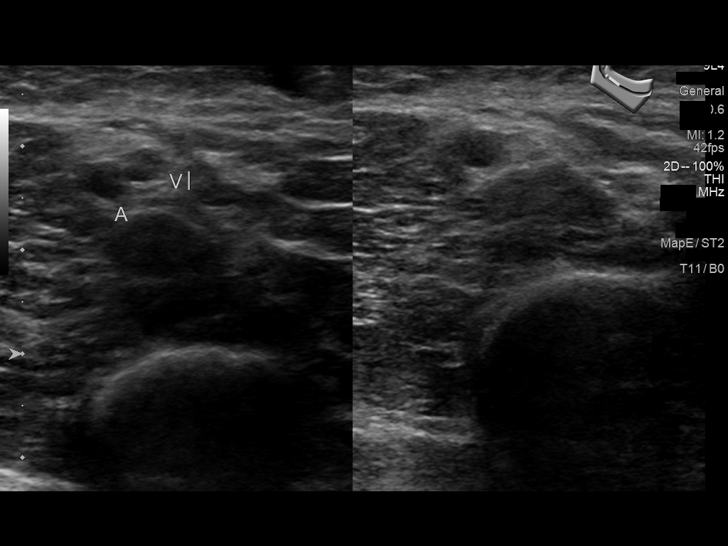
[im 17/30]
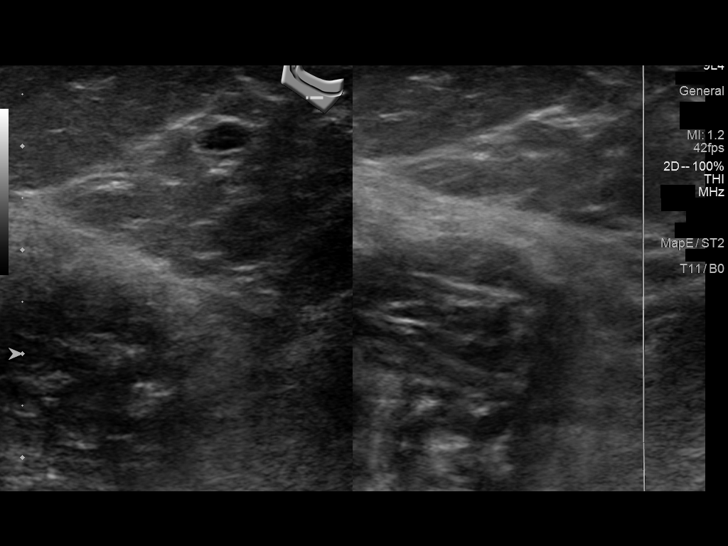
[im 19/30]
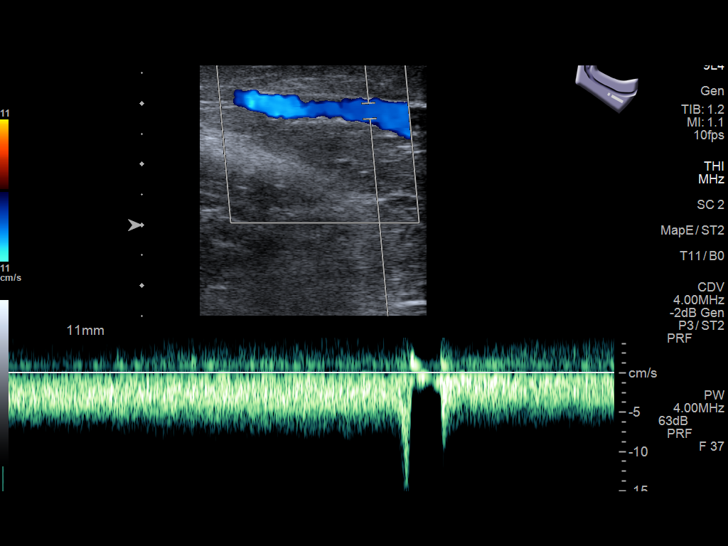
[im 22/30]
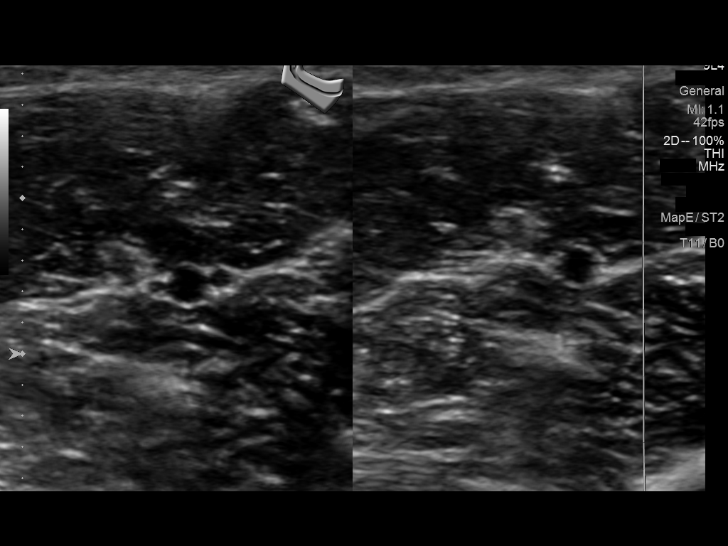
[im 24/30]
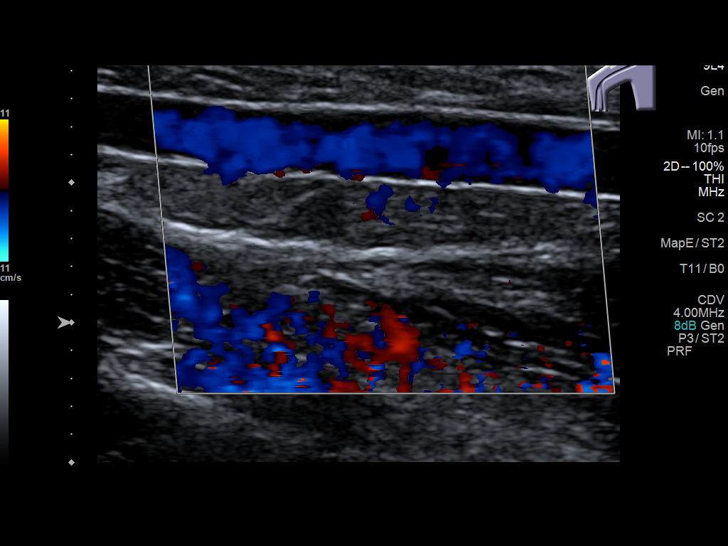
[im 27/30]
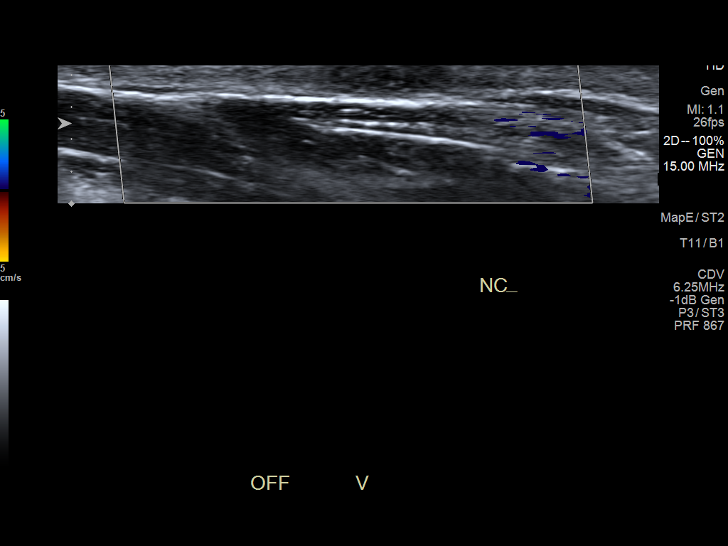
[im 30/30]
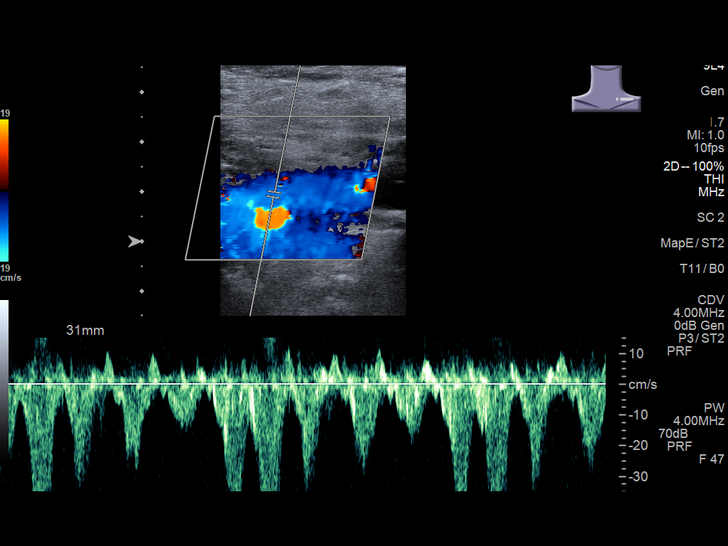

[13 of 24 positions shown; findings below may reference images not displayed]

FINDINGS: Contralateral Subclavian Vein: Respiratory phasicity is normal and
symmetric with the symptomatic side. No evidence of thrombus. Normal
compressibility.

Internal Jugular Vein: No evidence of thrombus. Normal
compressibility, respiratory phasicity and response to augmentation.

Subclavian Vein: No evidence of thrombus. Normal compressibility,
respiratory phasicity and response to augmentation.

Axillary Vein: No evidence of thrombus. Normal compressibility,
respiratory phasicity and response to augmentation.

Cephalic Vein: No evidence of thrombus. Normal compressibility,
respiratory phasicity and response to augmentation.

Basilic Vein: No evidence of thrombus. Normal compressibility,
respiratory phasicity and response to augmentation.

Brachial Veins: No evidence of thrombus. Normal compressibility,
respiratory phasicity and response to augmentation.

Radial Veins: No evidence of thrombus. Normal compressibility,
respiratory phasicity and response to augmentation.

Ulnar Veins: No evidence of thrombus. Normal compressibility,
respiratory phasicity and response to augmentation.

Venous Reflux:  None visualized.

Other Findings: At the site of dorsal forearm pain and swelling,
there is superficial thrombus within a distal branch of the right
cephalic vein. No extension into the deep venous system.
IMPRESSION: No evidence of DVT within the right upper extremity.

Superficial thrombophlebitis within the dorsal right forearm
involving a distal branch of the right cephalic vein.
